# Patient Record
Sex: Female | Born: 1940
Health system: Southern US, Community
[De-identification: ages and names within clinical notes are randomized; demographics above are authoritative.]

## PROBLEM LIST (undated history)

## (undated) DIAGNOSIS — R112 Nausea with vomiting, unspecified: Secondary | ICD-10-CM

## (undated) DIAGNOSIS — Z9889 Other specified postprocedural states: Secondary | ICD-10-CM

## (undated) DIAGNOSIS — I82409 Acute embolism and thrombosis of unspecified deep veins of unspecified lower extremity: Secondary | ICD-10-CM

## (undated) DIAGNOSIS — M1990 Unspecified osteoarthritis, unspecified site: Secondary | ICD-10-CM

## (undated) DIAGNOSIS — R51 Headache: Secondary | ICD-10-CM

## (undated) DIAGNOSIS — F419 Anxiety disorder, unspecified: Secondary | ICD-10-CM

## (undated) DIAGNOSIS — I10 Essential (primary) hypertension: Secondary | ICD-10-CM

## (undated) DIAGNOSIS — K219 Gastro-esophageal reflux disease without esophagitis: Secondary | ICD-10-CM

## (undated) DIAGNOSIS — K2970 Gastritis, unspecified, without bleeding: Secondary | ICD-10-CM

## (undated) HISTORY — DX: Gastritis, unspecified, without bleeding: K29.70

## (undated) HISTORY — PX: COLONOSCOPY: SHX5424

## (undated) HISTORY — PX: ESOPHAGOGASTRODUODENOSCOPY: SHX1529

## (undated) HISTORY — PX: BREAST SURGERY: SHX581

## (undated) HISTORY — PX: TONSILLECTOMY: SUR1361

## (undated) HISTORY — PX: DILATION AND CURETTAGE OF UTERUS: SHX78

## (undated) HISTORY — PX: EYE SURGERY: SHX253

## (undated) HISTORY — PX: APPENDECTOMY: SHX54

## (undated) HISTORY — PX: BACK SURGERY: SHX140

---

## 2013-06-18 NOTE — Progress Notes (Signed)
Need orders in EPIC.  Surgery scheduled for 07/07/13.  Thank You.  

## 2013-06-29 ENCOUNTER — Encounter (HOSPITAL_COMMUNITY): Payer: Self-pay | Admitting: Pharmacy Technician

## 2013-07-01 ENCOUNTER — Other Ambulatory Visit (HOSPITAL_COMMUNITY): Payer: Self-pay | Admitting: Orthopedic Surgery

## 2013-07-01 NOTE — Progress Notes (Signed)
ekg 06-09-13 and medical clearance note dr Bing Quarry on chart

## 2013-07-01 NOTE — Patient Instructions (Addendum)
20 Denise Hawkins  07/01/2013   Your procedure is scheduled on: Tuesday November 18th  Report to [facility] at 725  AM.  Call this number if you have problems the morning of surgery [phone]   Remember:   Do not eat food or drink liquids :After Midnight.     Take these medicines the morning of surgery with A SIP OF WATER: Atenolol, Amlodipine, Omeprazole                                SEE [facility] PREPARING FOR SURGERY SHEET             You may not have any metal on your body including hair pins and piercings  Do not wear jewelry, make-up.  Do not wear lotions, powders, or perfumes. You may wear deodorant.   Men may shave face and neck.  Do not bring valuables to the hospital. [facility] IS NOT RESPONSIBLE FOR VALUEABLES.  Contacts, dentures or bridgework may not be worn into surgery.  Leave suitcase in the car. After surgery it may be brought to your room.  For patients admitted to the hospital, checkout time is 11:00 AM the day of discharge.    Please read over the following fact sheets that you were given: mrsa information, blood fact sheet, incentive spirometer sheet  Call Merleen Nicely RN pre op nurse if needed [phone]    FAILURE TO FOLLOW THESE INSTRUCTIONS MAY RESULT IN THE CANCELLATION OF YOUR SURGERY.  PATIENT SIGNATURE___________________________________________  NURSE SIGNATURE_____________________________________________

## 2013-07-02 ENCOUNTER — Ambulatory Visit (HOSPITAL_COMMUNITY)
Admission: RE | Admit: 2013-07-02 | Discharge: 2013-07-02 | Disposition: A | Discharge status: Home / Self Care | Payer: Medicare Other | Source: Ambulatory Visit | Attending: Orthopedic Surgery | Admitting: Orthopedic Surgery

## 2013-07-02 ENCOUNTER — Encounter (HOSPITAL_COMMUNITY)
Admission: RE | Admit: 2013-07-02 | Discharge: 2013-07-02 | Disposition: A | Discharge status: Home / Self Care | Payer: Medicare Other | Source: Ambulatory Visit | Attending: Orthopedic Surgery | Admitting: Orthopedic Surgery

## 2013-07-02 ENCOUNTER — Encounter (HOSPITAL_COMMUNITY): Payer: Self-pay

## 2013-07-02 DIAGNOSIS — Z01818 Encounter for other preprocedural examination: Secondary | ICD-10-CM | POA: Insufficient documentation

## 2013-07-02 DIAGNOSIS — Z01812 Encounter for preprocedural laboratory examination: Secondary | ICD-10-CM | POA: Insufficient documentation

## 2013-07-02 DIAGNOSIS — M47814 Spondylosis without myelopathy or radiculopathy, thoracic region: Secondary | ICD-10-CM | POA: Insufficient documentation

## 2013-07-02 DIAGNOSIS — M169 Osteoarthritis of hip, unspecified: Secondary | ICD-10-CM | POA: Insufficient documentation

## 2013-07-02 DIAGNOSIS — M1610 Unilateral primary osteoarthritis, unspecified hip: Secondary | ICD-10-CM | POA: Insufficient documentation

## 2013-07-02 HISTORY — DX: Headache: R51

## 2013-07-02 HISTORY — DX: Unspecified osteoarthritis, unspecified site: M19.90

## 2013-07-02 HISTORY — DX: Other specified postprocedural states: Z98.890

## 2013-07-02 HISTORY — DX: Essential (primary) hypertension: I10

## 2013-07-02 HISTORY — DX: Anxiety disorder, unspecified: F41.9

## 2013-07-02 HISTORY — DX: Other specified postprocedural states: R11.2

## 2013-07-02 HISTORY — DX: Gastro-esophageal reflux disease without esophagitis: K21.9

## 2013-07-02 LAB — URINALYSIS, ROUTINE W REFLEX MICROSCOPIC
Bilirubin Urine: NEGATIVE
Glucose, UA: NEGATIVE mg/dL
Leukocytes, UA: NEGATIVE
Nitrite: NEGATIVE
Protein, ur: NEGATIVE mg/dL
pH: 7.5 (ref 5.0–8.0)

## 2013-07-02 LAB — CBC
HCT: 39.1 % (ref 36.0–46.0)
MCHC: 32.5 g/dL (ref 30.0–36.0)
MCV: 92.9 fL (ref 78.0–100.0)
Platelets: 257 10*3/uL (ref 150–400)
RDW: 13.2 % (ref 11.5–15.5)
WBC: 9.5 10*3/uL (ref 4.0–10.5)

## 2013-07-02 LAB — BASIC METABOLIC PANEL
BUN: 16 mg/dL (ref 6–23)
Calcium: 10.1 mg/dL (ref 8.4–10.5)
Chloride: 103 mEq/L (ref 96–112)
Creatinine, Ser: 0.94 mg/dL (ref 0.50–1.10)
GFR calc Af Amer: 68 mL/min — ABNORMAL LOW (ref >90)

## 2013-07-02 LAB — SURGICAL PCR SCREEN
MRSA, PCR: NEGATIVE
Staphylococcus aureus: NEGATIVE

## 2013-07-02 LAB — ABO/RH: ABO/RH(D): A POS

## 2013-07-02 NOTE — Pre-Procedure Instructions (Signed)
Patient states she is ALLERGIC to PENICILLINS AND SULFA MEDICATIONS! Thank You!

## 2013-07-02 NOTE — H&P (Signed)
TOTAL HIP ADMISSION H&P  Patient is admitted for right total hip arthroplasty, anterior approach.  Subjective:  Chief Complaint:    Right hip OA / pain  HPI: Denise Hawkins, 72 y.o. female, has a history of pain and functional disability in the right hip(s) due to arthritis and patient has failed non-surgical conservative treatments for greater than 12 weeks to include NSAID's and/or analgesics, corticosteriod injections, use of assistive devices and activity modification.  Onset of symptoms was gradual starting 1+ years ago with gradually worsening course since that time.The patient noted no past surgery on the right hip(s).  Patient currently rates pain in the right hip at 9 out of 10 with activity. Patient has night pain, worsening of pain with activity and weight bearing, trendelenberg gait, pain that interfers with activities of daily living and pain with passive range of motion. Patient has evidence of periarticular osteophytes and joint space narrowing by imaging studies. This condition presents safety issues increasing the risk of falls.  There is no current active infection.  Risks, benefits and expectations were discussed with the patient.  Risks including but not limited to the risk of anesthesia, blood clots, nerve damage, blood vessel damage, failure of the prosthesis, infection and up to and including death.  Patient understand the risks, benefits and expectations and wishes to proceed with surgery.   D/C Plans:   Home with HHPT (Daughter's house)  Post-op Meds:   No Rx given  Tranexamic Acid:   To be given  Decadron:    To be given  FYI:    Xarelto post-op (possible gastritis issues with ASA, may be willing to try)  Norco post-op   Past Medical History  Diagnosis Date  . PONV (postoperative nausea and vomiting)   . Hypertension   . GERD (gastroesophageal reflux disease)   . Anxiety   . Headache(784.0)     migraines  . Arthritis     Past Surgical History  Procedure  Laterality Date  . Back surgery    . Tonsillectomy    . Appendectomy    . Dilation and curettage of uterus      x4  . Breast surgery      right biopsy-benign  . Eye surgery      left with lens implant    No prescriptions prior to admission   Allergies  Allergen Reactions  . Latex Rash    Blisters  . Penicillins Rash  . Sulfa Antibiotics Rash    History  Substance Use Topics  . Smoking status: Never Smoker   . Smokeless tobacco: Not on file  . Alcohol Use: No    No family history on file.   Review of Systems  Constitutional: Negative.   Eyes: Negative.   Respiratory: Negative.   Cardiovascular: Negative.   Gastrointestinal: Positive for heartburn.  Genitourinary: Negative.   Musculoskeletal: Positive for joint pain.  Skin: Negative.   Neurological: Positive for headaches.  Endo/Heme/Allergies: Negative.   Psychiatric/Behavioral: The patient is nervous/anxious.     Objective:  Physical Exam  Constitutional: She is oriented to person, place, and time. She appears well-developed and well-nourished.  HENT:  Head: Normocephalic and atraumatic.  Mouth/Throat: Oropharynx is clear and moist.  Eyes: Pupils are equal, round, and reactive to light.  Neck: Neck supple. No JVD present. No tracheal deviation present. No thyromegaly present.  Cardiovascular: Normal rate, regular rhythm, normal heart sounds and intact distal pulses.   Respiratory: Effort normal and breath sounds normal. No stridor.  GI: Soft. There is no tenderness. There is no guarding.  Musculoskeletal:       Right hip: She exhibits decreased range of motion, decreased strength, tenderness and bony tenderness. She exhibits no swelling, no deformity and no laceration.  Lymphadenopathy:    She has no cervical adenopathy.  Neurological: She is alert and oriented to person, place, and time.  Skin: Skin is warm and dry.  Psychiatric: She has a normal mood and affect.    Vital signs in last 24 hours: Temp:   [98.1 F (36.7 C)] 98.1 F (36.7 C) (11/13 1133) Pulse Rate:  [66] 66 (11/13 1133) Resp:  [16] 16 (11/13 1133) BP: (137)/(77) 137/77 mmHg (11/13 1133) SpO2:  [96 %] 96 % (11/13 1133) Weight:  [77.565 kg (171 lb)] 77.565 kg (171 lb) (11/13 1133)   Imaging Review Plain radiographs demonstrate severe degenerative joint disease of the right hip(s). The bone quality appears to be good for age and reported activity level.  Assessment/Plan:  End stage arthritis, right hip(s)  The patient history, physical examination, clinical judgement of the provider and imaging studies are consistent with end stage degenerative joint disease of the right hip(s) and total hip arthroplasty is deemed medically necessary. The treatment options including medical management, injection therapy, arthroscopy and arthroplasty were discussed at length. The risks and benefits of total hip arthroplasty were presented and reviewed. The risks due to aseptic loosening, infection, stiffness, dislocation/subluxation,  thromboembolic complications and other imponderables were discussed.  The patient acknowledged the explanation, agreed to proceed with the plan and consent was signed. Patient is being admitted for inpatient treatment for surgery, pain control, PT, OT, prophylactic antibiotics, VTE prophylaxis, progressive ambulation and ADL's and discharge planning.The patient is planning to be discharged home with home health services.     Anastasio Auerbach Desjuan Stearns   PAC  07/02/2013, 6:05 PM

## 2013-07-06 NOTE — Pre-Procedure Instructions (Signed)
Talked to patient's daughter Enid Skeens and informed her of time change for tomorrow-to arrive at Short Stay at 1205 pm  On 07/07/2013. Daughter verbalized understanding.

## 2013-07-07 ENCOUNTER — Inpatient Hospital Stay (HOSPITAL_COMMUNITY): Payer: Medicare Other

## 2013-07-07 ENCOUNTER — Encounter (HOSPITAL_COMMUNITY): Payer: Self-pay | Admitting: *Deleted

## 2013-07-07 ENCOUNTER — Encounter (HOSPITAL_COMMUNITY): Payer: Medicare Other | Admitting: Anesthesiology

## 2013-07-07 ENCOUNTER — Encounter (HOSPITAL_COMMUNITY)
Admission: RE | Disposition: A | Discharge status: Home Health | Payer: Self-pay | Source: Ambulatory Visit | Attending: Orthopedic Surgery

## 2013-07-07 ENCOUNTER — Inpatient Hospital Stay (HOSPITAL_COMMUNITY): Payer: Medicare Other | Admitting: Anesthesiology

## 2013-07-07 ENCOUNTER — Inpatient Hospital Stay (HOSPITAL_COMMUNITY)
Admission: RE | Admit: 2013-07-07 | Discharge: 2013-07-09 | DRG: 470 | Disposition: A | Discharge status: Home Health | Payer: Medicare Other | Source: Ambulatory Visit | Attending: Orthopedic Surgery | Admitting: Orthopedic Surgery

## 2013-07-07 DIAGNOSIS — I10 Essential (primary) hypertension: Secondary | ICD-10-CM | POA: Diagnosis present

## 2013-07-07 DIAGNOSIS — E663 Overweight: Secondary | ICD-10-CM

## 2013-07-07 DIAGNOSIS — K219 Gastro-esophageal reflux disease without esophagitis: Secondary | ICD-10-CM | POA: Diagnosis present

## 2013-07-07 DIAGNOSIS — D500 Iron deficiency anemia secondary to blood loss (chronic): Secondary | ICD-10-CM | POA: Diagnosis not present

## 2013-07-07 DIAGNOSIS — D62 Acute posthemorrhagic anemia: Secondary | ICD-10-CM | POA: Diagnosis not present

## 2013-07-07 DIAGNOSIS — Z96649 Presence of unspecified artificial hip joint: Secondary | ICD-10-CM

## 2013-07-07 DIAGNOSIS — M169 Osteoarthritis of hip, unspecified: Principal | ICD-10-CM | POA: Diagnosis present

## 2013-07-07 DIAGNOSIS — F411 Generalized anxiety disorder: Secondary | ICD-10-CM | POA: Diagnosis present

## 2013-07-07 DIAGNOSIS — M1610 Unilateral primary osteoarthritis, unspecified hip: Principal | ICD-10-CM | POA: Diagnosis present

## 2013-07-07 DIAGNOSIS — Z6825 Body mass index (BMI) 25.0-25.9, adult: Secondary | ICD-10-CM

## 2013-07-07 HISTORY — PX: TOTAL HIP ARTHROPLASTY: SHX124

## 2013-07-07 SURGERY — ARTHROPLASTY, HIP, TOTAL, ANTERIOR APPROACH
Anesthesia: General | Site: Hip | Laterality: Right | Wound class: Clean

## 2013-07-07 MED ORDER — DOCUSATE SODIUM 100 MG PO CAPS
100.0000 mg | ORAL_CAPSULE | Freq: Two times a day (BID) | ORAL | Status: DC
  Administered 2013-07-08 – 2013-07-09 (×3): 100 mg via ORAL

## 2013-07-07 MED ORDER — HYDROMORPHONE HCL PF 1 MG/ML IJ SOLN
0.2500 mg | INTRAMUSCULAR | Status: DC | PRN
  Administered 2013-07-07 (×4): 0.25 mg via INTRAVENOUS

## 2013-07-07 MED ORDER — SUFENTANIL CITRATE 50 MCG/ML IV SOLN
INTRAVENOUS | Status: AC
  Filled 2013-07-07: qty 1

## 2013-07-07 MED ORDER — HYDROMORPHONE HCL PF 1 MG/ML IJ SOLN
0.5000 mg | INTRAMUSCULAR | Status: DC | PRN
  Administered 2013-07-07: 1 mg via INTRAVENOUS
  Filled 2013-07-07: qty 1

## 2013-07-07 MED ORDER — POLYETHYLENE GLYCOL 3350 17 G PO PACK
17.0000 g | PACK | Freq: Two times a day (BID) | ORAL | Status: DC
  Administered 2013-07-08 – 2013-07-09 (×3): 17 g via ORAL

## 2013-07-07 MED ORDER — ATENOLOL 25 MG PO TABS
25.0000 mg | ORAL_TABLET | Freq: Every morning | ORAL | Status: DC
  Administered 2013-07-08 – 2013-07-09 (×2): 25 mg via ORAL
  Filled 2013-07-07 (×2): qty 1

## 2013-07-07 MED ORDER — LIDOCAINE HCL (CARDIAC) 20 MG/ML IV SOLN
INTRAVENOUS | Status: DC | PRN
  Administered 2013-07-07: 100 mg via INTRAVENOUS

## 2013-07-07 MED ORDER — CALCIUM CARBONATE ANTACID 750 MG PO CHEW: 4.0000 | CHEWABLE_TABLET | Freq: Every day | ORAL | Status: DC

## 2013-07-07 MED ORDER — DEXAMETHASONE SODIUM PHOSPHATE 10 MG/ML IJ SOLN
10.0000 mg | Freq: Once | INTRAMUSCULAR | Status: AC
  Administered 2013-07-08: 17:00:00 10 mg via INTRAVENOUS
  Filled 2013-07-07: qty 1

## 2013-07-07 MED ORDER — CLINDAMYCIN PHOSPHATE 600 MG/50ML IV SOLN
600.0000 mg | Freq: Four times a day (QID) | INTRAVENOUS | Status: AC
  Administered 2013-07-07 – 2013-07-08 (×2): 600 mg via INTRAVENOUS
  Filled 2013-07-07 (×2): qty 50

## 2013-07-07 MED ORDER — SODIUM CHLORIDE 0.9 % IV SOLN
100.0000 mL/h | INTRAVENOUS | Status: DC
  Administered 2013-07-07 – 2013-07-08 (×2): 100 mL/h via INTRAVENOUS
  Filled 2013-07-07 (×7): qty 1000

## 2013-07-07 MED ORDER — SCOPOLAMINE 1 MG/3DAYS TD PT72
MEDICATED_PATCH | TRANSDERMAL | Status: AC
  Filled 2013-07-07: qty 1

## 2013-07-07 MED ORDER — PROPOFOL 10 MG/ML IV BOLUS
INTRAVENOUS | Status: DC | PRN
  Administered 2013-07-07: 150 mg via INTRAVENOUS

## 2013-07-07 MED ORDER — SUCCINYLCHOLINE CHLORIDE 20 MG/ML IJ SOLN
INTRAMUSCULAR | Status: AC
  Filled 2013-07-07: qty 1

## 2013-07-07 MED ORDER — MIDAZOLAM HCL 2 MG/2ML IJ SOLN
INTRAMUSCULAR | Status: AC
  Filled 2013-07-07: qty 2

## 2013-07-07 MED ORDER — ZOLPIDEM TARTRATE 5 MG PO TABS: 5.0000 mg | ORAL_TABLET | Freq: Every evening | ORAL | Status: DC | PRN

## 2013-07-07 MED ORDER — NEOSTIGMINE METHYLSULFATE 1 MG/ML IJ SOLN
INTRAMUSCULAR | Status: AC
  Filled 2013-07-07: qty 10

## 2013-07-07 MED ORDER — DEXAMETHASONE SODIUM PHOSPHATE 10 MG/ML IJ SOLN
INTRAMUSCULAR | Status: DC | PRN
  Administered 2013-07-07: 10 mg via INTRAVENOUS

## 2013-07-07 MED ORDER — PROMETHAZINE HCL 25 MG/ML IJ SOLN
6.2500 mg | INTRAMUSCULAR | Status: AC | PRN
  Administered 2013-07-07 (×2): 6.25 mg via INTRAVENOUS

## 2013-07-07 MED ORDER — ONDANSETRON HCL 4 MG/2ML IJ SOLN
INTRAMUSCULAR | Status: DC | PRN
  Administered 2013-07-07: 4 mg via INTRAVENOUS

## 2013-07-07 MED ORDER — CALCIUM CARBONATE ANTACID 500 MG PO CHEW
4.0000 | CHEWABLE_TABLET | Freq: Every day | ORAL | Status: DC
  Administered 2013-07-08 – 2013-07-09 (×2): 800 mg via ORAL
  Filled 2013-07-07 (×2): qty 4

## 2013-07-07 MED ORDER — LACTATED RINGERS IV SOLN
INTRAVENOUS | Status: DC
  Administered 2013-07-07: 1000 mL via INTRAVENOUS
  Administered 2013-07-07: 16:00:00 via INTRAVENOUS

## 2013-07-07 MED ORDER — EPHEDRINE SULFATE 50 MG/ML IJ SOLN
INTRAMUSCULAR | Status: DC | PRN
  Administered 2013-07-07: 10 mg via INTRAVENOUS

## 2013-07-07 MED ORDER — HYDROMORPHONE HCL PF 1 MG/ML IJ SOLN
INTRAMUSCULAR | Status: AC
  Filled 2013-07-07: qty 1

## 2013-07-07 MED ORDER — LORAZEPAM 0.5 MG PO TABS: 0.5000 mg | ORAL_TABLET | Freq: Three times a day (TID) | ORAL | Status: DC | PRN

## 2013-07-07 MED ORDER — METHOCARBAMOL 100 MG/ML IJ SOLN
500.0000 mg | Freq: Four times a day (QID) | INTRAVENOUS | Status: DC | PRN
  Filled 2013-07-07 (×2): qty 5

## 2013-07-07 MED ORDER — HYDROMORPHONE HCL PF 1 MG/ML IJ SOLN
INTRAMUSCULAR | Status: DC | PRN
  Administered 2013-07-07: .4 mg via INTRAVENOUS

## 2013-07-07 MED ORDER — ONDANSETRON HCL 4 MG PO TABS: 4.0000 mg | ORAL_TABLET | Freq: Four times a day (QID) | ORAL | Status: DC | PRN

## 2013-07-07 MED ORDER — METOCLOPRAMIDE HCL 5 MG/ML IJ SOLN: 5.0000 mg | Freq: Three times a day (TID) | INTRAMUSCULAR | Status: DC | PRN

## 2013-07-07 MED ORDER — SUFENTANIL CITRATE 50 MCG/ML IV SOLN
INTRAVENOUS | Status: DC | PRN
  Administered 2013-07-07: 15 ug via INTRAVENOUS
  Administered 2013-07-07: 10 ug via INTRAVENOUS

## 2013-07-07 MED ORDER — NEOSTIGMINE METHYLSULFATE 1 MG/ML IJ SOLN
INTRAMUSCULAR | Status: DC | PRN
  Administered 2013-07-07: 4 mg via INTRAVENOUS

## 2013-07-07 MED ORDER — PROPOFOL 10 MG/ML IV BOLUS
INTRAVENOUS | Status: AC
  Filled 2013-07-07: qty 20

## 2013-07-07 MED ORDER — TRANEXAMIC ACID 100 MG/ML IV SOLN
1000.0000 mg | Freq: Once | INTRAVENOUS | Status: AC
  Administered 2013-07-07: 1000 mg via INTRAVENOUS
  Filled 2013-07-07: qty 10

## 2013-07-07 MED ORDER — AMLODIPINE BESYLATE 5 MG PO TABS
5.0000 mg | ORAL_TABLET | Freq: Every morning | ORAL | Status: DC
  Administered 2013-07-08 – 2013-07-09 (×2): 5 mg via ORAL
  Filled 2013-07-07 (×2): qty 1

## 2013-07-07 MED ORDER — METHOCARBAMOL 500 MG PO TABS
500.0000 mg | ORAL_TABLET | Freq: Four times a day (QID) | ORAL | Status: DC | PRN
  Administered 2013-07-08 – 2013-07-09 (×3): 500 mg via ORAL
  Filled 2013-07-07 (×3): qty 1

## 2013-07-07 MED ORDER — HYDROMORPHONE HCL PF 2 MG/ML IJ SOLN
INTRAMUSCULAR | Status: AC
  Filled 2013-07-07: qty 1

## 2013-07-07 MED ORDER — GLYCOPYRROLATE 0.2 MG/ML IJ SOLN
INTRAMUSCULAR | Status: AC
  Filled 2013-07-07: qty 3

## 2013-07-07 MED ORDER — CEFAZOLIN SODIUM-DEXTROSE 2-3 GM-% IV SOLR
2.0000 g | INTRAVENOUS | Status: AC
  Administered 2013-07-07: 2 g via INTRAVENOUS

## 2013-07-07 MED ORDER — CEFAZOLIN SODIUM-DEXTROSE 2-3 GM-% IV SOLR
INTRAVENOUS | Status: AC
  Filled 2013-07-07: qty 50

## 2013-07-07 MED ORDER — GLYCOPYRROLATE 0.2 MG/ML IJ SOLN
INTRAMUSCULAR | Status: DC | PRN
  Administered 2013-07-07: .6 mg via INTRAVENOUS

## 2013-07-07 MED ORDER — PANTOPRAZOLE SODIUM 40 MG PO TBEC
40.0000 mg | DELAYED_RELEASE_TABLET | Freq: Every day | ORAL | Status: DC
  Administered 2013-07-08 – 2013-07-09 (×2): 40 mg via ORAL
  Filled 2013-07-07 (×2): qty 1

## 2013-07-07 MED ORDER — 0.9 % SODIUM CHLORIDE (POUR BTL) OPTIME
TOPICAL | Status: DC | PRN
  Administered 2013-07-07: 1000 mL

## 2013-07-07 MED ORDER — DEXAMETHASONE SODIUM PHOSPHATE 10 MG/ML IJ SOLN
INTRAMUSCULAR | Status: AC
  Filled 2013-07-07: qty 1

## 2013-07-07 MED ORDER — FERROUS SULFATE 325 (65 FE) MG PO TABS
325.0000 mg | ORAL_TABLET | Freq: Three times a day (TID) | ORAL | Status: DC
  Administered 2013-07-08 – 2013-07-09 (×4): 325 mg via ORAL
  Filled 2013-07-07 (×7): qty 1

## 2013-07-07 MED ORDER — MIDAZOLAM HCL 5 MG/5ML IJ SOLN
INTRAMUSCULAR | Status: DC | PRN
  Administered 2013-07-07: 2 mg via INTRAVENOUS

## 2013-07-07 MED ORDER — PROMETHAZINE HCL 25 MG/ML IJ SOLN
INTRAMUSCULAR | Status: AC
  Filled 2013-07-07: qty 1

## 2013-07-07 MED ORDER — CISATRACURIUM BESYLATE (PF) 10 MG/5ML IV SOLN
INTRAVENOUS | Status: DC | PRN
  Administered 2013-07-07: 6 mg via INTRAVENOUS

## 2013-07-07 MED ORDER — PHENOL 1.4 % MT LIQD: 1.0000 | OROMUCOSAL | Status: DC | PRN

## 2013-07-07 MED ORDER — ALUM & MAG HYDROXIDE-SIMETH 200-200-20 MG/5ML PO SUSP: 30.0000 mL | ORAL | Status: DC | PRN

## 2013-07-07 MED ORDER — METOCLOPRAMIDE HCL 10 MG PO TABS: 5.0000 mg | ORAL_TABLET | Freq: Three times a day (TID) | ORAL | Status: DC | PRN

## 2013-07-07 MED ORDER — DIPHENHYDRAMINE HCL 25 MG PO CAPS: 25.0000 mg | ORAL_CAPSULE | Freq: Four times a day (QID) | ORAL | Status: DC | PRN

## 2013-07-07 MED ORDER — SODIUM CHLORIDE 0.9 % IJ SOLN
INTRAMUSCULAR | Status: AC
  Filled 2013-07-07: qty 10

## 2013-07-07 MED ORDER — CISATRACURIUM BESYLATE 20 MG/10ML IV SOLN
INTRAVENOUS | Status: AC
  Filled 2013-07-07: qty 10

## 2013-07-07 MED ORDER — SUCCINYLCHOLINE CHLORIDE 20 MG/ML IJ SOLN
INTRAMUSCULAR | Status: DC | PRN
  Administered 2013-07-07: 80 mg via INTRAVENOUS

## 2013-07-07 MED ORDER — RIVAROXABAN 10 MG PO TABS
10.0000 mg | ORAL_TABLET | Freq: Every day | ORAL | Status: DC
  Administered 2013-07-08 – 2013-07-09 (×2): 10 mg via ORAL
  Filled 2013-07-07 (×2): qty 1

## 2013-07-07 MED ORDER — HYDROCODONE-ACETAMINOPHEN 7.5-325 MG PO TABS
1.0000 | ORAL_TABLET | ORAL | Status: DC
  Administered 2013-07-07 – 2013-07-09 (×8): 2 via ORAL
  Administered 2013-07-09: 1 via ORAL
  Filled 2013-07-07 (×9): qty 2

## 2013-07-07 MED ORDER — MENTHOL 3 MG MT LOZG: 1.0000 | LOZENGE | OROMUCOSAL | Status: DC | PRN

## 2013-07-07 MED ORDER — ONDANSETRON HCL 4 MG/2ML IJ SOLN: 4.0000 mg | Freq: Four times a day (QID) | INTRAMUSCULAR | Status: DC | PRN

## 2013-07-07 MED ORDER — BISACODYL 10 MG RE SUPP: 10.0000 mg | Freq: Every day | RECTAL | Status: DC | PRN

## 2013-07-07 MED ORDER — ONDANSETRON HCL 4 MG/2ML IJ SOLN
INTRAMUSCULAR | Status: AC
  Filled 2013-07-07: qty 2

## 2013-07-07 MED ORDER — DEXAMETHASONE SODIUM PHOSPHATE 10 MG/ML IJ SOLN
10.0000 mg | Freq: Once | INTRAMUSCULAR | Status: AC
  Administered 2013-07-07: 10 mg via INTRAVENOUS

## 2013-07-07 MED ORDER — FLEET ENEMA 7-19 GM/118ML RE ENEM: 1.0000 | ENEMA | Freq: Once | RECTAL | Status: AC | PRN

## 2013-07-07 SURGICAL SUPPLY — 37 items
BAG ZIPLOCK 12X15 (MISCELLANEOUS) IMPLANT
BLADE SAW SGTL 18X1.27X75 (BLADE) ×2 IMPLANT
CAPT HIP PF MOP ×2 IMPLANT
DERMABOND ADVANCED (GAUZE/BANDAGES/DRESSINGS) ×1
DERMABOND ADVANCED .7 DNX12 (GAUZE/BANDAGES/DRESSINGS) ×1 IMPLANT
DRAPE C-ARM 42X120 X-RAY (DRAPES) ×2 IMPLANT
DRAPE STERI IOBAN 125X83 (DRAPES) ×2 IMPLANT
DRAPE U-SHAPE 47X51 STRL (DRAPES) ×6 IMPLANT
DRSG AQUACEL AG ADV 3.5X10 (GAUZE/BANDAGES/DRESSINGS) ×2 IMPLANT
DRSG TEGADERM 4X4.75 (GAUZE/BANDAGES/DRESSINGS) IMPLANT
DURAPREP 26ML APPLICATOR (WOUND CARE) ×2 IMPLANT
ELECT BLADE TIP CTD 4 INCH (ELECTRODE) ×2 IMPLANT
ELECT REM PT RETURN 9FT ADLT (ELECTROSURGICAL) ×2
ELECTRODE REM PT RTRN 9FT ADLT (ELECTROSURGICAL) ×1 IMPLANT
EVACUATOR 1/8 PVC DRAIN (DRAIN) IMPLANT
FACESHIELD LNG OPTICON STERILE (SAFETY) ×8 IMPLANT
GAUZE SPONGE 2X2 8PLY STRL LF (GAUZE/BANDAGES/DRESSINGS) IMPLANT
GLOVE BIOGEL PI IND STRL 7.5 (GLOVE) ×1 IMPLANT
GLOVE BIOGEL PI IND STRL 8 (GLOVE) ×1 IMPLANT
GLOVE BIOGEL PI INDICATOR 7.5 (GLOVE) ×1
GLOVE BIOGEL PI INDICATOR 8 (GLOVE) ×1
GLOVE ECLIPSE 8.0 STRL XLNG CF (GLOVE) ×2 IMPLANT
GLOVE ORTHO TXT STRL SZ7.5 (GLOVE) ×4 IMPLANT
GOWN BRE IMP PREV XXLGXLNG (GOWN DISPOSABLE) ×2 IMPLANT
GOWN PREVENTION PLUS LG XLONG (DISPOSABLE) ×2 IMPLANT
KIT BASIN OR (CUSTOM PROCEDURE TRAY) ×2 IMPLANT
PACK TOTAL JOINT (CUSTOM PROCEDURE TRAY) ×2 IMPLANT
PADDING CAST COTTON 6X4 STRL (CAST SUPPLIES) ×2 IMPLANT
SPONGE GAUZE 2X2 STER 10/PKG (GAUZE/BANDAGES/DRESSINGS)
SUCTION FRAZIER 12FR DISP (SUCTIONS) IMPLANT
SUT MNCRL AB 4-0 PS2 18 (SUTURE) ×2 IMPLANT
SUT VIC AB 1 CT1 36 (SUTURE) ×6 IMPLANT
SUT VIC AB 2-0 CT1 27 (SUTURE) ×2
SUT VIC AB 2-0 CT1 TAPERPNT 27 (SUTURE) ×2 IMPLANT
SUT VLOC 180 0 24IN GS25 (SUTURE) ×2 IMPLANT
TOWEL OR 17X26 10 PK STRL BLUE (TOWEL DISPOSABLE) ×2 IMPLANT
TRAY FOLEY CATH 14FRSI W/METER (CATHETERS) ×2 IMPLANT

## 2013-07-07 NOTE — Anesthesia Procedure Notes (Signed)
Procedure Name: Intubation Date/Time: 07/07/2013 3:22 PM Performed by: Leroy Libman L Patient Re-evaluated:Patient Re-evaluated prior to inductionOxygen Delivery Method: Circle system utilized Preoxygenation: Pre-oxygenation with 100% oxygen Intubation Type: IV induction Ventilation: Mask ventilation without difficulty and Oral airway inserted - appropriate to patient size Laryngoscope Size: Hyacinth Meeker and 2 Grade View: Grade II Tube type: Oral Tube size: 7.5 mm Number of attempts: 1 Airway Equipment and Method: Stylet Placement Confirmation: ETT inserted through vocal cords under direct vision,  breath sounds checked- equal and bilateral and positive ETCO2 Secured at: 20 cm Tube secured with: Tape Dental Injury: Teeth and Oropharynx as per pre-operative assessment

## 2013-07-07 NOTE — Preoperative (Signed)
Beta Blockers   Reason not to administer Beta Blockers:Not Applicable 

## 2013-07-07 NOTE — Interval H&P Note (Signed)
History and Physical Interval Note:  07/07/2013 3:02 PM  [name]  has presented today for surgery, with the diagnosis of osteoarthritis of the right hip  The various methods of treatment have been discussed with the patient and family. After consideration of risks, benefits and other options for treatment, the patient has consented to  Procedure(s): RIGHT TOTAL HIP ARTHROPLASTY ANTERIOR APPROACH (Right) as a surgical intervention .  The patient's history has been reviewed, patient examined, no change in status, stable for surgery.  I have reviewed the patient's chart and labs.  Questions were answered to the patient's satisfaction.     Denise Hawkins

## 2013-07-07 NOTE — Anesthesia Preprocedure Evaluation (Addendum)
Anesthesia Evaluation  Patient identified by MRN, date of birth, ID band Patient awake    Reviewed: Allergy & Precautions, H&P , NPO status , Patient's Chart, lab work & pertinent test results  History of Anesthesia Complications (+) PONV and history of anesthetic complications  Airway Mallampati: II TM Distance: >3 FB Neck ROM: Full    Dental no notable dental hx.    Pulmonary neg pulmonary ROS,  breath sounds clear to auscultation  Pulmonary exam normal       Cardiovascular hypertension, Pt. on medications and Pt. on home beta blockers negative cardio ROS  Rhythm:Regular Rate:Normal     Neuro/Psych  Headaches, Anxiety    GI/Hepatic Neg liver ROS, GERD-  Medicated,  Endo/Other  negative endocrine ROS  Renal/GU negative Renal ROS  negative genitourinary   Musculoskeletal negative musculoskeletal ROS (+)   Abdominal   Peds negative pediatric ROS (+)  Hematology negative hematology ROS (+)   Anesthesia Other Findings   Reproductive/Obstetrics negative OB ROS                         Anesthesia Physical Anesthesia Plan  ASA: II  Anesthesia Plan: General   Post-op Pain Management:    Induction: Intravenous  Airway Management Planned: Oral ETT  Additional Equipment:   Intra-op Plan:   Post-operative Plan: Extubation in OR  Informed Consent: I have reviewed the patients History and Physical, chart, labs and discussed the procedure including the risks, benefits and alternatives for the proposed anesthesia with the patient or authorized representative who has indicated his/her understanding and acceptance.   Dental advisory given  Plan Discussed with: CRNA  Anesthesia Plan Comments: (Discussed r/b general versus spinal. Patient prefers general.)       Anesthesia Quick Evaluation

## 2013-07-07 NOTE — Transfer of Care (Signed)
Immediate Anesthesia Transfer of Care Note  Patient: Denise Hawkins  Procedure(s) Performed: Procedure(s): RIGHT TOTAL HIP ARTHROPLASTY ANTERIOR APPROACH (Right)  Patient Location: PACU  Anesthesia Type:General  Level of Consciousness: awake, alert  and oriented  Airway & Oxygen Therapy: Patient Spontanous Breathing and Patient connected to face mask oxygen  Post-op Assessment: Report given to PACU RN and Post -op Vital signs reviewed and stable  Post vital signs: Reviewed and stable  Complications: No apparent anesthesia complications

## 2013-07-07 NOTE — Op Note (Signed)
NAME:  Denise Hawkins                ACCOUNT NO.: [id number]      MEDICAL RECORD NO.: [id number]      FACILITY:  [facility]      PHYSICIAN:  Durene Romans D  DATE OF BIRTH:  01-19-1941     DATE OF PROCEDURE:  07/07/2013                                 OPERATIVE REPORT         PREOPERATIVE DIAGNOSIS: Right  hip osteoarthritis.      POSTOPERATIVE DIAGNOSIS:  Right hip osteoarthritis.      PROCEDURE:  Right total hip replacement through an anterior approach   utilizing DePuy THR system, component size 50mm pinnacle cup, a size 32+4 neutral   Altrex liner, a size 6 Hi Tri Lock stem with a 32+1 Articuleze metal ball.      SURGEON:  Madlyn Frankel. Charlann Boxer, M.D.      ASSISTANT:  Lanney Gins, PA-C     ANESTHESIA:  General.      SPECIMENS:  None.      COMPLICATIONS:  None.      BLOOD LOSS:  300 cc     DRAINS:  none.      INDICATION OF THE PROCEDURE:  Denise Hawkins is a 72 y.o. female who had   presented to office for evaluation of right hip pain.  Radiographs revealed   progressive degenerative changes with bone-on-bone   articulation to the  hip joint.  The patient had painful limited range of   motion significantly affecting their overall quality of life.  The patient was failing to    respond to conservative measures, and at this point was ready   to proceed with more definitive measures.  The patient has noted progressive   degenerative changes in his hip, progressive problems and dysfunction   with regarding the hip prior to surgery.  Consent was obtained for   benefit of pain relief.  Specific risk of infection, DVT, component   failure, dislocation, need for revision surgery, as well discussion of   the anterior versus posterior approach were reviewed.  Consent was   obtained for benefit of anterior pain relief through an anterior   approach.      PROCEDURE IN DETAIL:  The patient was brought to operative theater.   Once adequate anesthesia,  preoperative antibiotics, 2gm Ancef administered.   The patient was positioned supine on the OSI Hanna table.  Once adequate   padding of boney process was carried out, we had predraped out the hip, and  used fluoroscopy to confirm orientation of the pelvis and position.      The right hip was then prepped and draped from proximal iliac crest to   mid thigh with shower curtain technique.      Time-out was performed identifying the patient, planned procedure, and   extremity.     An incision was then made 2 cm distal and lateral to the   anterior superior iliac spine extending over the orientation of the   tensor fascia lata muscle and sharp dissection was carried down to the   fascia of the muscle and protractor placed in the soft tissues.      The fascia was then incised.  The muscle belly was identified and swept   laterally  and retractor placed along the superior neck.  Following   cauterization of the circumflex vessels and removing some pericapsular   fat, a second cobra retractor was placed on the inferior neck.  A third   retractor was placed on the anterior acetabulum after elevating the   anterior rectus.  A L-capsulotomy was along the line of the   superior neck to the trochanteric fossa, then extended proximally and   distally.  Tag sutures were placed and the retractors were then placed   intracapsular.  We then identified the trochanteric fossa and   orientation of my neck cut, confirmed this radiographically   and then made a neck osteotomy with the femur on traction.  The femoral   head was removed without difficulty or complication.  Traction was let   off and retractors were placed posterior and anterior around the   acetabulum.      The labrum and foveal tissue were debrided.  I began reaming with a 47mm   reamer and reamed up to 49mm reamer with good bony bed preparation and a 50   cup was chosen.  The final 50mm Pinnacle cup was then impacted under fluoroscopy  to  confirm the depth of penetration and orientation with respect to   abduction.  A screw was placed followed by the hole eliminator.  The final   32+4 neutral Altrex liner was impacted with good visualized rim fit.  The cup was positioned anatomically within the acetabular portion of the pelvis.      At this point, the femur was rolled at 80 degrees.  Further capsule was   released off the inferior aspect of the femoral neck.  I then   released the superior capsule proximally.  The hook was placed laterally   along the femur and elevated manually and held in position with the bed   hook.  The leg was then extended and adducted with the leg rolled to 100   degrees of external rotation.  Once the proximal femur was fully   exposed, I used a box osteotome to set orientation.  I then began   broaching with the starting chili pepper broach and passed this by hand and then broached up to 6.  With the 6 broach in place I chose a high offset neck and did a trial reduction.  The offset was appropriate, leg lengths   appeared to be equal, confirmed radiographically.   Given these findings, I went ahead and dislocated the hip, repositioned all   retractors and positioned the right hip in the extended and abducted position.  The final 6 Hi Tri Lock stem was   chosen and it was impacted down to the level of neck cut.  Based on this   and the trial reduction, a 32+1 Articuleze metal ball was chosen and   impacted onto a clean and dry trunnion, and the hip was reduced.  The   hip had been irrigated throughout the case again at this point.  I did   reapproximate the superior capsular leaflet to the anterior leaflet   using #1 Vicryl.  The fascia of the   tensor fascia lata muscle was then reapproximated using #1 Vicryl and #0 V-lock sutures.  The   remaining wound was closed with 2-0 Vicryl and running 4-0 Monocryl.   The hip was cleaned, dried, and dressed sterilely using Dermabond and   Aquacel dressing.   She was then brought   to recovery room in  stable condition tolerating the procedure well.    Lanney Gins, PA-C was present for the entirety of the case involved from   preoperative positioning, perioperative retractor management, general   facilitation of the case, as well as primary wound closure as assistant.            Madlyn Frankel Charlann Boxer, M.D.        07/07/2013 4:52 PM

## 2013-07-07 NOTE — Anesthesia Postprocedure Evaluation (Signed)
  Anesthesia Post-op Note  Patient: Denise Hawkins  Procedure(s) Performed: Procedure(s) (LRB): RIGHT TOTAL HIP ARTHROPLASTY ANTERIOR APPROACH (Right)  Patient Location: PACU  Anesthesia Type: General  Level of Consciousness: awake and alert   Airway and Oxygen Therapy: Patient Spontanous Breathing  Post-op Pain: mild  Post-op Assessment: Post-op Vital signs reviewed, Patient's Cardiovascular Status Stable, Respiratory Function Stable, Patent Airway and No signs of Nausea or vomiting  Last Vitals:  Filed Vitals:   07/07/13 1717  BP: 123/71  Pulse: 75  Temp: 36.3 C  Resp: 12    Post-op Vital Signs: stable   Complications: No apparent anesthesia complications

## 2013-07-08 ENCOUNTER — Encounter (HOSPITAL_COMMUNITY): Payer: Self-pay | Admitting: Orthopedic Surgery

## 2013-07-08 DIAGNOSIS — D500 Iron deficiency anemia secondary to blood loss (chronic): Secondary | ICD-10-CM | POA: Diagnosis not present

## 2013-07-08 DIAGNOSIS — E663 Overweight: Secondary | ICD-10-CM

## 2013-07-08 LAB — BASIC METABOLIC PANEL
BUN: 17 mg/dL (ref 6–23)
Creatinine, Ser: 0.84 mg/dL (ref 0.50–1.10)
GFR calc Af Amer: 78 mL/min — ABNORMAL LOW (ref >90)
GFR calc non Af Amer: 67 mL/min — ABNORMAL LOW (ref >90)
Potassium: 4.7 mEq/L (ref 3.5–5.1)

## 2013-07-08 LAB — CBC
HCT: 33 % — ABNORMAL LOW (ref 36.0–46.0)
MCH: 30.8 pg (ref 26.0–34.0)
MCHC: 33 g/dL (ref 30.0–36.0)
Platelets: 197 10*3/uL (ref 150–400)
RDW: 12.8 % (ref 11.5–15.5)

## 2013-07-08 NOTE — Progress Notes (Signed)
Utilization review completed.  

## 2013-07-08 NOTE — Progress Notes (Signed)
   Subjective: 1 Day Post-Op Procedure(s) (LRB): RIGHT TOTAL HIP ARTHROPLASTY ANTERIOR APPROACH (Right)   Patient reports pain as mild, pain controlled. No events throughout the night.   Objective:   VITALS:   Filed Vitals:   07/08/13 0650  BP: 117/74  Pulse: 63  Temp: 97.7 F (36.5 C)  Resp: 14    Neurovascular intact Dorsiflexion/Plantar flexion intact Incision: dressing C/D/I No cellulitis present Compartment soft  LABS  Recent Labs  07/08/13 0355  HGB 10.9*  HCT 33.0*  WBC 11.4*  PLT 197     Recent Labs  07/08/13 0355  NA 136  K 4.7  BUN 17  CREATININE 0.84  GLUCOSE 165*     Assessment/Plan: 1 Day Post-Op Procedure(s) (LRB): RIGHT TOTAL HIP ARTHROPLASTY ANTERIOR APPROACH (Right) Foley cath d/c'ed Advance diet Up with therapy D/C IV fluids Discharge home with home health eventually, possibly on Thursday  Expected ABLA  Treated with iron and will observe  Overweight (BMI 25-29.9) Estimated body mass index is 26.01 kg/(m^2) as calculated from the following:   Height as of this encounter: 5\' 8"  (1.727 m).   Weight as of this encounter: 77.565 kg (171 lb). Patient also counseled that weight may inhibit the healing process Patient counseled that losing weight will help with future health issues      Anastasio Auerbach. Shevelle Smither   PAC  07/08/2013, 7:41 AM

## 2013-07-08 NOTE — Progress Notes (Signed)
[facility]  [facility] is providing the following services: [employer] - (patient already has rw at home)  If patient discharges after hours, please call [phone].   Renard Hamper 07/08/2013, 10:22 AM

## 2013-07-08 NOTE — Progress Notes (Signed)
OT Cancellation Note  Patient Details Name: Denise Hawkins MRN: [id number] DOB: 08-Feb-1941   Cancelled Treatment:    Reason Eval/Treat Not Completed: Other (comment).  Pt nauseas with PT while ambulating. Will likely check back tomorrow.    Sharion Grieves 07/08/2013, 10:12 AM Marica Otter, OTR/L [phone] 07/08/2013

## 2013-07-08 NOTE — Evaluation (Signed)
Physical Therapy Evaluation Patient Details Name: Denise Hawkins MRN: [id number] DOB: Jun 10, 1941 Today's Date: 07/08/2013 Time: 7829-5621 PT Time Calculation (min): 29 min  PT Assessment / Plan / Recommendation History of Present Illness     Clinical Impression  Pt s/p R THR presents with decreased R LE strength/ROM and post op pain limiting functional mobility.  Pt should progress well to d/c home with family assist and HHPT follow up.    PT Assessment  Patient needs continued PT services    Follow Up Recommendations  Home health PT    Does the patient have the potential to tolerate intense rehabilitation      Barriers to Discharge        Equipment Recommendations  None recommended by PT    Recommendations for Other Services OT consult   Frequency 7X/week    Precautions / Restrictions Precautions Precautions: Fall Restrictions Weight Bearing Restrictions: No Other Position/Activity Restrictions: WBAT   Pertinent Vitals/Pain 5/10; premed, ice pack provided.      Mobility  Bed Mobility Bed Mobility: Supine to Sit Supine to Sit: 3: Mod assist Details for Bed Mobility Assistance: cues for sequence and use of L LE to self assist Transfers Transfers: Sit to Stand;Stand to Sit Sit to Stand: 4: Min assist;3: Mod assist Stand to Sit: 4: Min assist Details for Transfer Assistance: cues for LE management and use of UEs to self assist Ambulation/Gait Ambulation/Gait Assistance: 4: Min assist;3: Mod assist Ambulation Distance (Feet): 14 Feet Assistive device: Rolling walker Ambulation/Gait Assistance Details: cues for sequence, posture, position from RW and stride length Gait Pattern: Step-to pattern;Decreased step length - right;Decreased step length - left;Shuffle;Antalgic General Gait Details: LTd by onset nausea/dizziness    Exercises Total Joint Exercises Ankle Circles/Pumps: AROM;15 reps;Supine;Both Quad Sets: AROM;Both;10 reps;Supine Heel Slides: AAROM;15  reps;Supine;Right Hip ABduction/ADduction: AAROM;Right;10 reps;Supine   PT Diagnosis: Difficulty walking  PT Problem List: Decreased strength;Decreased range of motion;Decreased activity tolerance;Decreased mobility;Decreased knowledge of use of DME;Pain PT Treatment Interventions: DME instruction;Gait training;Stair training;Functional mobility training;Therapeutic activities;Therapeutic exercise;Patient/family education     PT Goals(Current goals can be found in the care plan section) Acute Rehab PT Goals Patient Stated Goal: Be able to walk without a walker PT Goal Formulation: With patient Time For Goal Achievement: 07/15/13 Potential to Achieve Goals: Good  Visit Information  Last PT Received On: 07/08/13 Assistance Needed: +2 (dizzines/nausea with ambulation)       Prior Functioning  Home Living Family/patient expects to be discharged to:: Private residence Living Arrangements: Spouse/significant other Available Help at Discharge: Family Type of Home: House Home Access: Stairs to enter Secretary/administrator of Steps: 2 Entrance Stairs-Rails: None Home Layout: One level Home Equipment: Environmental consultant - 2 wheels Prior Function Level of Independence: Independent with assistive device(s) Communication Communication: No difficulties Dominant Hand: Right    Cognition  Cognition Arousal/Alertness: Awake/alert Behavior During Therapy: WFL for tasks assessed/performed Overall Cognitive Status: Within Functional Limits for tasks assessed    Extremity/Trunk Assessment Upper Extremity Assessment Upper Extremity Assessment: Overall WFL for tasks assessed Lower Extremity Assessment Lower Extremity Assessment: RLE deficits/detail RLE Deficits / Details: Hip strength 2/5 with AROM to 90 flex and 15 abd   Balance    End of Session PT - End of Session Equipment Utilized During Treatment: Gait belt Activity Tolerance: Patient tolerated treatment well Patient left: in chair;with call  bell/phone within reach;with family/visitor present Nurse Communication: Mobility status  GP     Julene Rahn 07/08/2013, 11:55 AM

## 2013-07-08 NOTE — Progress Notes (Signed)
Physical Therapy Treatment Patient Details Name: Denise Hawkins MRN: [id number] DOB: [birth year] Today's Date: 07/08/2013 Time: 4098-1191 PT Time Calculation (min): 24 min  PT Assessment / Plan / Recommendation  History of Present Illness     PT Comments   Pt with marked improvement in activity tolerance this pm.   Follow Up Recommendations  Home health PT     Does the patient have the potential to tolerate intense rehabilitation     Barriers to Discharge        Equipment Recommendations  None recommended by PT    Recommendations for Other Services OT consult  Frequency 7X/week   Progress towards PT Goals Progress towards PT goals: Progressing toward goals  Plan Current plan remains appropriate    Precautions / Restrictions Precautions Precautions: Fall Restrictions Weight Bearing Restrictions: No Other Position/Activity Restrictions: WBAT   Pertinent Vitals/Pain 6/10; premed, ice pack provided    Mobility  Bed Mobility Bed Mobility: Supine to Sit;Sit to Supine Supine to Sit: 4: Min assist Sit to Supine: 4: Min assist Details for Bed Mobility Assistance: cues for sequence and use of L LE to self assist Transfers Transfers: Sit to Stand;Stand to Sit Sit to Stand: 4: Min assist;With armrests;With upper extremity assist;From bed;From chair/3-in-1 Stand to Sit: 4: Min assist;To bed;To chair/3-in-1;With armrests;With upper extremity assist Details for Transfer Assistance: cues for LE management and use of UEs to self assist Ambulation/Gait Ambulation/Gait Assistance: 4: Min assist Ambulation Distance (Feet): 120 Feet (and 5' from chair to bed) Assistive device: Rolling walker Ambulation/Gait Assistance Details: cues for sequence, posture, position from RW and stride length Gait Pattern: Step-to pattern;Decreased step length - right;Decreased step length - left;Shuffle;Antalgic    Exercises     PT Diagnosis:    PT Problem List:   PT Treatment Interventions:      PT Goals (current goals can now be found in the care plan section) Acute Rehab PT Goals Patient Stated Goal: Be able to walk without a walker PT Goal Formulation: With patient Time For Goal Achievement: 07/15/13 Potential to Achieve Goals: Good  Visit Information  Last PT Received On: 07/08/13 Assistance Needed: +1    Subjective Data  Patient Stated Goal: Be able to walk without a walker   Cognition  Cognition Arousal/Alertness: Awake/alert Behavior During Therapy: WFL for tasks assessed/performed Overall Cognitive Status: Within Functional Limits for tasks assessed    Balance     End of Session PT - End of Session Equipment Utilized During Treatment: Gait belt Activity Tolerance: Patient tolerated treatment well Patient left: with call bell/phone within reach;with family/visitor present;in bed Nurse Communication: Mobility status   GP     Denise Hawkins 07/08/2013, 4:04 PM

## 2013-07-09 LAB — BASIC METABOLIC PANEL
BUN: 12 mg/dL (ref 6–23)
CO2: 26 mEq/L (ref 19–32)
Calcium: 9.5 mg/dL (ref 8.4–10.5)
Chloride: 104 mEq/L (ref 96–112)
Creatinine, Ser: 0.72 mg/dL (ref 0.50–1.10)
Potassium: 4.5 mEq/L (ref 3.5–5.1)
Sodium: 137 mEq/L (ref 135–145)

## 2013-07-09 LAB — CBC
HCT: 30.1 % — ABNORMAL LOW (ref 36.0–46.0)
MCHC: 32.9 g/dL (ref 30.0–36.0)
MCV: 92.9 fL (ref 78.0–100.0)
RBC: 3.24 MIL/uL — ABNORMAL LOW (ref 3.87–5.11)
RDW: 13.1 % (ref 11.5–15.5)
WBC: 15.3 10*3/uL — ABNORMAL HIGH (ref 4.0–10.5)

## 2013-07-09 MED ORDER — TIZANIDINE HCL 4 MG PO TABS: 4.0000 mg | ORAL_TABLET | Freq: Four times a day (QID) | ORAL | Status: DC | PRN

## 2013-07-09 MED ORDER — FERROUS SULFATE 325 (65 FE) MG PO TABS: 325.0000 mg | ORAL_TABLET | Freq: Three times a day (TID) | ORAL | Status: DC

## 2013-07-09 MED ORDER — POLYETHYLENE GLYCOL 3350 17 G PO PACK: 17.0000 g | PACK | Freq: Two times a day (BID) | ORAL | Status: DC

## 2013-07-09 MED ORDER — DSS 100 MG PO CAPS: 100.0000 mg | ORAL_CAPSULE | Freq: Two times a day (BID) | ORAL | Status: DC

## 2013-07-09 MED ORDER — RIVAROXABAN 10 MG PO TABS: 10.0000 mg | ORAL_TABLET | Freq: Every day | ORAL | Status: DC

## 2013-07-09 MED ORDER — HYDROCODONE-ACETAMINOPHEN 7.5-325 MG PO TABS: 1.0000 | ORAL_TABLET | ORAL | Status: DC | PRN

## 2013-07-09 NOTE — Progress Notes (Signed)
Physical Therapy Treatment Patient Details Name: Denise Hawkins MRN: [id number] DOB: November 23, 1940 Today's Date: 07/09/2013 Time: 4098-1191 PT Time Calculation (min): 29 min  PT Assessment / Plan / Recommendation  History of Present Illness R direct anterior THA   PT Comments   Reviewed stairs and car transfers with pt and family members  Follow Up Recommendations  Home health PT     Does the patient have the potential to tolerate intense rehabilitation     Barriers to Discharge        Equipment Recommendations  None recommended by PT    Recommendations for Other Services OT consult  Frequency 7X/week   Progress towards PT Goals Progress towards PT goals: Progressing toward goals  Plan Current plan remains appropriate    Precautions / Restrictions Precautions Precautions: Fall Restrictions Weight Bearing Restrictions: No Other Position/Activity Restrictions: WBAT   Pertinent Vitals/Pain 3/10    Mobility  Bed Mobility Bed Mobility: Sit to Supine Supine to Sit: 4: Min assist Sit to Supine: 4: Min guard Details for Bed Mobility Assistance: cues for sequence and use of L LE to self assist Transfers Transfers: Sit to Stand;Stand to Sit Sit to Stand: 4: Min guard;5: Supervision Stand to Sit: 4: Min guard;5: Supervision Details for Transfer Assistance: VC for use of UEs to self assist Ambulation/Gait Ambulation/Gait Assistance: 4: Min guard;5: Supervision Ambulation Distance (Feet): 222 Feet Assistive device: Rolling walker Ambulation/Gait Assistance Details: min cues for posture and position from RW Gait Pattern: Step-to pattern;Step-through pattern;Decreased step length - right;Decreased step length - left;Shuffle;Antalgic;Trunk flexed Stairs: Yes Stairs Assistance: 4: Min assist Stairs Assistance Details (indicate cue type and reason): cues for sequence and foot/RW placement Stair Management Technique: No rails;Step to pattern;Backwards;With walker Number of  Stairs: 6    Exercises     PT Diagnosis:    PT Problem List:   PT Treatment Interventions:     PT Goals (current goals can now be found in the care plan section) Acute Rehab PT Goals Patient Stated Goal: be independent PT Goal Formulation: With patient Time For Goal Achievement: 07/15/13 Potential to Achieve Goals: Good  Visit Information  Last PT Received On: 07/09/13 Assistance Needed: +1 History of Present Illness: R direct anterior THA    Subjective Data  Subjective: I'm ready to go home Patient Stated Goal: be independent   Cognition  Cognition Arousal/Alertness: Awake/alert Behavior During Therapy: WFL for tasks assessed/performed Overall Cognitive Status: Within Functional Limits for tasks assessed    Balance  Balance Balance Assessed: Yes Dynamic Standing Balance Dynamic Standing - Level of Assistance: 4: Min assist  End of Session PT - End of Session Equipment Utilized During Treatment: Gait belt Activity Tolerance: Patient tolerated treatment well Patient left: with call bell/phone within reach;with family/visitor present;in bed Nurse Communication: Mobility status   GP     Lucious Zou 07/09/2013, 11:30 AM

## 2013-07-09 NOTE — Care Management Note (Signed)
    Page 1 of 2   07/09/2013     5:41:40 PM   CARE MANAGEMENT NOTE 07/09/2013  Patient:  Denise Hawkins, Denise Hawkins   Account Number:  [id number]  Date Initiated:  07/09/2013  Documentation initiated by:  Colleen Can  Subjective/Objective Assessment:   dx Total right hip replacemnt    [facility]evieve Norlander will provide [facility] services with start of tomorrow.     Action/Plan:   CM spoke with patient and her family. Plans are for patient to go to her daughter's home in [place] where she will be cared for. Pt lives in [place]. Genevieve Norlander [facility] services will provide [facility] services.   Anticipated DC Date:  07/09/2013   Anticipated DC Plan:  HOME W HOME HEALTH SERVICES  In-house referral  Clinical Social Worker      DC Planning Services  CM consult      [facility] Choice  HOME HEALTH  DURABLE MEDICAL EQUIPMENT   Choice offered to / List presented to:  C-4 Adult Children   DME arranged  3-N-1      DME agency  [facility]     HH arranged  HH-2 PT      [facility] agency  [facility]   Status of service:  Completed, signed off Medicare Important Message given?  NA - LOS <3 / Initial given by admissions (If response is "NO", the following Medicare IM given date fields will be blank) Date Medicare IM given:   Date Additional Medicare IM given:    Discharge Disposition:  HOME W HOME HEALTH SERVICES  Per UR Regulation:    If discussed at Long Length of Stay Meetings, dates discussed:    Comments:

## 2013-07-09 NOTE — Progress Notes (Signed)
   Subjective: 2 Days Post-Op Procedure(s) (LRB): RIGHT TOTAL HIP ARTHROPLASTY ANTERIOR APPROACH (Right)   Patient reports pain as mild, pain well controlled. No events throughout the night. Ready to be discharged home.   Objective:   VITALS:   Filed Vitals:   07/09/13 0533  BP: 116/70  Pulse: 64  Temp: 97.9 F (36.6 C)  Resp: 16    Neurovascular intact Dorsiflexion/Plantar flexion intact Incision: dressing C/D/I No cellulitis present Compartment soft  LABS  Recent Labs  07/08/13 0355 07/09/13 0405  HGB 10.9* 9.9*  HCT 33.0* 30.1*  WBC 11.4* 15.3*  PLT 197 175     Recent Labs  07/08/13 0355 07/09/13 0405  NA 136 137  K 4.7 4.5  BUN 17 12  CREATININE 0.84 0.72  GLUCOSE 165* 167*     Assessment/Plan: 2 Days Post-Op Procedure(s) (LRB): RIGHT TOTAL HIP ARTHROPLASTY ANTERIOR APPROACH (Right) Up with therapy Discharge home with home health Follow up in 2 weeks at [facility]. Follow up with OLIN,[place] Salamone D in 2 weeks.  Contact information:  [facility] [address], [address] [place] [address] [phone]    Expected ABLA  Treated with iron and will observe   Overweight (BMI 25-29.9)  Estimated body mass index is 26.01 kg/(m^2) as calculated from the following:      Height as of this encounter: 5\' 8"  (1.727 m).      Weight as of this encounter: 77.565 kg (171 lb).  Patient also counseled that weight may inhibit the healing process  Patient counseled that losing weight will help with future health issues    Anastasio Auerbach. Nohealani Medinger   PAC  07/09/2013, 8:16 AM

## 2013-07-09 NOTE — Evaluation (Signed)
Occupational Therapy Evaluation Patient Details Name: Denise Hawkins MRN: [id number] DOB: [birth year] Today's Date: 07/09/2013 Time: 0822-0849 OT Time Calculation (min): 27 min  OT Assessment / Plan / Recommendation History of present illness R direct anterior THA   Clinical Impression   Pt is doing well. Daughter present for session and can help PRN at d/c. Pt supposed to d/c today. All education completed.     OT Assessment  Patient does not need any further OT services    Follow Up Recommendations  No OT follow up;Supervision/Assistance - 24 hour    Barriers to Discharge      Equipment Recommendations  3 in 1 bedside comode    Recommendations for Other Services    Frequency       Precautions / Restrictions Precautions Precautions: Fall Restrictions Weight Bearing Restrictions: No Other Position/Activity Restrictions: WBAT   Pertinent Vitals/Pain 4/10 R hip; reposition, ice    ADL  Eating/Feeding: Simulated;Independent Where Assessed - Eating/Feeding: Chair Grooming: Performed;Wash/dry hands;Min guard Where Assessed - Grooming: Unsupported standing Upper Body Bathing: Simulated;Chest;Right arm;Left arm;Abdomen;Set up Where Assessed - Upper Body Bathing: Unsupported sitting Lower Body Bathing: Simulated;Minimal assistance Where Assessed - Lower Body Bathing: Supported sit to stand Upper Body Dressing: Performed;Set up Where Assessed - Upper Body Dressing: Unsupported sitting Lower Body Dressing: Performed;Minimal assistance Where Assessed - Lower Body Dressing: Supported sit to stand Toilet Transfer: Performed;Min guard Acupuncturist: Raised toilet seat with arms (or 3-in-1 over toilet) Toileting - Architect and Hygiene: Min guard Where Assessed - Glass blower/designer Manipulation and Hygiene: Standing Equipment Used: Long-handled shoe horn;Long-handled sponge;Reacher;Rolling walker;Sock aid ADL Comments: Demonstrated all AE to pt and  daughter but pt is able to reach down well enough to don pants and underwear. Pt plans to get AE to use initially. Needs a little assist with socks. Pt plans to sponge bathe initially at daughters. Instructed daughter on how to assist with steadying pt if needed with pulling up clothes, etc in standing.    OT Diagnosis:    OT Problem List:   OT Treatment Interventions:     OT Goals(Current goals can be found in the care plan section) Acute Rehab OT Goals Patient Stated Goal: be independent  Visit Information  Last OT Received On: 07/09/13 Assistance Needed: +1 History of Present Illness: R direct anterior THA       Prior Functioning     Home Living Family/patient expects to be discharged to:: Private residence Living Arrangements: Spouse/significant other Available Help at Discharge: Family Type of Home: House Home Access: Stairs to enter Secretary/administrator of Steps: 2 Entrance Stairs-Rails: None Home Layout: One level Home Equipment: Environmental consultant - 2 wheels Additional Comments: staying with daugther initially Prior Function Level of Independence: Independent with assistive device(s) Communication Communication: No difficulties Dominant Hand: Right         Vision/Perception     Cognition  Cognition Arousal/Alertness: Awake/alert Behavior During Therapy: WFL for tasks assessed/performed Overall Cognitive Status: Within Functional Limits for tasks assessed    Extremity/Trunk Assessment Upper Extremity Assessment Upper Extremity Assessment: Overall WFL for tasks assessed     Mobility Bed Mobility Bed Mobility: Supine to Sit Supine to Sit: 4: Min assist Transfers Transfers: Sit to Stand;Stand to Sit Sit to Stand: 4: Min guard;With upper extremity assist;From bed;From chair/3-in-1 Stand to Sit: 4: Min guard;With upper extremity assist;To chair/3-in-1 Details for Transfer Assistance: verbal cues for hand placement and LE management.     Exercise     Balance  Balance Balance Assessed: Yes Dynamic Standing Balance Dynamic Standing - Level of Assistance: 4: Min assist   End of Session OT - End of Session Equipment Utilized During Treatment: Rolling walker Activity Tolerance: Patient tolerated treatment well Patient left: in chair;with call bell/phone within reach;with family/visitor present  GO     Lennox Laity [phone] 07/09/2013, 9:00 AM

## 2013-07-13 NOTE — Discharge Summary (Signed)
Physician Discharge Summary  Patient ID: Denise Hawkins MRN: [id number] DOB/AGE: June 13, 1941 72 y.o.  Admit date: 07/07/2013 Discharge date: 07/09/2013   Procedures:  Procedure(s) (LRB): RIGHT TOTAL HIP ARTHROPLASTY ANTERIOR APPROACH (Right)  Attending Physician:  Dr. Durene Romans   Admission Diagnoses:   Right hip OA / pain  Discharge Diagnoses:  Principal Problem:   S/P right THA, AA Active Problems:   Expected blood loss anemia   Overweight (BMI 25.0-29.9)  Past Medical History  Diagnosis Date  . PONV (postoperative nausea and vomiting)   . Hypertension   . GERD (gastroesophageal reflux disease)   . Anxiety   . Headache(784.0)     migraines  . Arthritis     HPI: Denise Hawkins, 72 y.o. female, has a history of pain and functional disability in the right hip(s) due to arthritis and patient has failed non-surgical conservative treatments for greater than 12 weeks to include NSAID's and/or analgesics, corticosteriod injections, use of assistive devices and activity modification. Onset of symptoms was gradual starting 1+ years ago with gradually worsening course since that time.The patient noted no past surgery on the right hip(s). Patient currently rates pain in the right hip at 9 out of 10 with activity. Patient has night pain, worsening of pain with activity and weight bearing, trendelenberg gait, pain that interfers with activities of daily living and pain with passive range of motion. Patient has evidence of periarticular osteophytes and joint space narrowing by imaging studies. This condition presents safety issues increasing the risk of falls. There is no current active infection. Risks, benefits and expectations were discussed with the patient. Risks including but not limited to the risk of anesthesia, blood clots, nerve damage, blood vessel damage, failure of the prosthesis, infection and up to and including death. Patient understand the risks, benefits and expectations  and wishes to proceed with surgery.   PCP: Default, Provider, MD   Discharged Condition: good  Hospital Course:  Patient underwent the above stated procedure on 07/07/2013. Patient tolerated the procedure well and brought to the recovery room in good condition and subsequently to the floor.  POD #1 BP: 117/74 ; Pulse: 63 ; Temp: 97.7 F (36.5 C) ; Resp: 14  Pt's foley was removed. IV was changed to a saline lock. Patient reports pain as mild, pain controlled. No events throughout the night. Neurovascular intact, dorsiflexion/plantar flexion intact, incision: dressing C/D/I, no cellulitis present and compartment soft.   LABS  Basename    HGB  10.9  HCT  33.0   POD #2  BP: 116/70 ; Pulse: 64 ; Temp: 97.9 F (36.6 C) ; Resp: 16 Patient reports pain as mild, pain well controlled. No events throughout the night. Ready to be discharged home.  Neurovascular intact, dorsiflexion/plantar flexion intact, incision: dressing C/D/I, no cellulitis present and compartment soft.   LABS  Basename    HGB  9.9  HCT  30.1    Discharge Exam: General appearance: alert, cooperative and no distress Extremities: Homans sign is negative, no sign of DVT, no edema, redness or tenderness in the calves or thighs and no ulcers, gangrene or trophic changes  Disposition:    Home-Health Care Svc with follow up in 2 weeks   Follow-up Information   Follow up with Shelda Pal, MD. Schedule an appointment as soon as possible for a visit in 2 weeks.   Specialty:  Orthopedic Surgery   Contact information:   [address] [place] [address] [phone]  Discharge Orders   Future Orders Complete By Expires   Call MD / Call 911  As directed    Comments:     If you experience chest pain or shortness of breath, CALL 911 and be transported to the hospital emergency room.  If you develope a fever above 101 F, pus (white drainage) or increased drainage or redness at the wound, or  calf pain, call your surgeon's office.   Change dressing  As directed    Comments:     Maintain surgical dressing for 10-14 days, then replace with 4x4 guaze and tape. Keep the area dry and clean.   Constipation Prevention  As directed    Comments:     Drink plenty of fluids.  Prune juice may be helpful.  You may use a stool softener, such as Colace (over the counter) 100 mg twice a day.  Use MiraLax (over the counter) for constipation as needed.   Diet - low sodium heart healthy  As directed    Discharge instructions  As directed    Comments:     Maintain surgical dressing for 10-14 days, then replace with gauze and tape. Keep the area dry and clean until follow up. Follow up in 2 weeks at [facility]. Call with any questions or concerns.   Driving restrictions  As directed    Comments:     No driving for 4 weeks   Increase activity slowly as tolerated  As directed    TED hose  As directed    Comments:     Use stockings (TED hose) for 2 weeks on both leg(s).  You may remove them at night for sleeping.   Weight bearing as tolerated  As directed    Questions:     Laterality:     Extremity:          Medication List    STOP taking these medications       HYDROcodone-acetaminophen 5-325 MG per tablet  Commonly known as:  NORCO/VICODIN  Replaced by:  HYDROcodone-acetaminophen 7.5-325 MG per tablet      TAKE these medications       amLODipine 5 MG tablet  Commonly known as:  NORVASC  Take 5 mg by mouth every morning.     atenolol 25 MG tablet  Commonly known as:  TENORMIN  Take 25 mg by mouth every morning.     calcium carbonate 750 MG chewable tablet  Commonly known as:  TUMS EX  Chew 4 tablets by mouth daily.     DSS 100 MG Caps  Take 100 mg by mouth 2 (two) times daily.     enalapril 20 MG tablet  Commonly known as:  VASOTEC  Take 20 mg by mouth 2 (two) times daily.     ferrous sulfate 325 (65 FE) MG tablet  Take 1 tablet (325 mg total) by mouth 3  (three) times daily after meals.     HYDROcodone-acetaminophen 7.5-325 MG per tablet  Commonly known as:  NORCO  Take 1-2 tablets by mouth every 4 (four) hours as needed for moderate pain.     LORazepam 0.5 MG tablet  Commonly known as:  ATIVAN  Take 0.5 mg by mouth every 8 (eight) hours as needed.     omeprazole 20 MG capsule  Commonly known as:  PRILOSEC  Take 20 mg by mouth daily.     polyethylene glycol packet  Commonly known as:  MIRALAX / GLYCOLAX  Take 17 g by mouth  2 (two) times daily.     rivaroxaban 10 MG Tabs tablet  Commonly known as:  XARELTO  Take 1 tablet (10 mg total) by mouth daily.     tiZANidine 4 MG tablet  Commonly known as:  ZANAFLEX  Take 1 tablet (4 mg total) by mouth every 6 (six) hours as needed for muscle spasms.         Signed: Anastasio Auerbach. Salle Brandle   PAC  07/13/2013, 9:44 AM

## 2016-11-09 DIAGNOSIS — I10 Essential (primary) hypertension: Secondary | ICD-10-CM | POA: Diagnosis not present

## 2016-11-09 DIAGNOSIS — Z23 Encounter for immunization: Secondary | ICD-10-CM | POA: Diagnosis not present

## 2016-11-09 DIAGNOSIS — F411 Generalized anxiety disorder: Secondary | ICD-10-CM | POA: Diagnosis not present

## 2016-12-05 DIAGNOSIS — H16223 Keratoconjunctivitis sicca, not specified as Sjogren's, bilateral: Secondary | ICD-10-CM | POA: Diagnosis not present

## 2016-12-05 DIAGNOSIS — H2511 Age-related nuclear cataract, right eye: Secondary | ICD-10-CM | POA: Diagnosis not present

## 2016-12-05 DIAGNOSIS — H40013 Open angle with borderline findings, low risk, bilateral: Secondary | ICD-10-CM | POA: Diagnosis not present

## 2016-12-05 DIAGNOSIS — I10 Essential (primary) hypertension: Secondary | ICD-10-CM | POA: Diagnosis not present

## 2016-12-05 DIAGNOSIS — H26492 Other secondary cataract, left eye: Secondary | ICD-10-CM | POA: Diagnosis not present

## 2016-12-28 DIAGNOSIS — H16223 Keratoconjunctivitis sicca, not specified as Sjogren's, bilateral: Secondary | ICD-10-CM | POA: Diagnosis not present

## 2016-12-28 DIAGNOSIS — H25811 Combined forms of age-related cataract, right eye: Secondary | ICD-10-CM | POA: Diagnosis not present

## 2016-12-28 DIAGNOSIS — H26492 Other secondary cataract, left eye: Secondary | ICD-10-CM | POA: Diagnosis not present

## 2016-12-28 DIAGNOSIS — H02831 Dermatochalasis of right upper eyelid: Secondary | ICD-10-CM | POA: Diagnosis not present

## 2017-01-07 DIAGNOSIS — H26492 Other secondary cataract, left eye: Secondary | ICD-10-CM | POA: Diagnosis not present

## 2017-01-25 DIAGNOSIS — H25811 Combined forms of age-related cataract, right eye: Secondary | ICD-10-CM | POA: Diagnosis not present

## 2017-01-25 DIAGNOSIS — H16223 Keratoconjunctivitis sicca, not specified as Sjogren's, bilateral: Secondary | ICD-10-CM | POA: Diagnosis not present

## 2017-01-25 DIAGNOSIS — H02834 Dermatochalasis of left upper eyelid: Secondary | ICD-10-CM | POA: Diagnosis not present

## 2017-01-25 DIAGNOSIS — H02831 Dermatochalasis of right upper eyelid: Secondary | ICD-10-CM | POA: Diagnosis not present

## 2017-02-07 DIAGNOSIS — I10 Essential (primary) hypertension: Secondary | ICD-10-CM | POA: Diagnosis not present

## 2017-02-13 DIAGNOSIS — H25811 Combined forms of age-related cataract, right eye: Secondary | ICD-10-CM | POA: Diagnosis not present

## 2017-02-14 DIAGNOSIS — H25811 Combined forms of age-related cataract, right eye: Secondary | ICD-10-CM | POA: Diagnosis not present

## 2017-04-30 DIAGNOSIS — Z23 Encounter for immunization: Secondary | ICD-10-CM | POA: Diagnosis not present

## 2017-05-20 DIAGNOSIS — B359 Dermatophytosis, unspecified: Secondary | ICD-10-CM | POA: Diagnosis not present

## 2017-07-31 DIAGNOSIS — K219 Gastro-esophageal reflux disease without esophagitis: Secondary | ICD-10-CM | POA: Diagnosis not present

## 2017-07-31 DIAGNOSIS — K2970 Gastritis, unspecified, without bleeding: Secondary | ICD-10-CM | POA: Diagnosis not present

## 2017-09-26 DIAGNOSIS — E7800 Pure hypercholesterolemia, unspecified: Secondary | ICD-10-CM | POA: Diagnosis not present

## 2017-09-26 DIAGNOSIS — R252 Cramp and spasm: Secondary | ICD-10-CM | POA: Diagnosis not present

## 2017-09-26 DIAGNOSIS — M4807 Spinal stenosis, lumbosacral region: Secondary | ICD-10-CM | POA: Diagnosis not present

## 2017-09-26 DIAGNOSIS — Z86018 Personal history of other benign neoplasm: Secondary | ICD-10-CM | POA: Diagnosis not present

## 2017-09-26 DIAGNOSIS — I10 Essential (primary) hypertension: Secondary | ICD-10-CM | POA: Diagnosis not present

## 2017-09-26 DIAGNOSIS — Z7689 Persons encountering health services in other specified circumstances: Secondary | ICD-10-CM | POA: Diagnosis not present

## 2017-09-26 DIAGNOSIS — K2950 Unspecified chronic gastritis without bleeding: Secondary | ICD-10-CM | POA: Diagnosis not present

## 2017-09-26 DIAGNOSIS — R600 Localized edema: Secondary | ICD-10-CM | POA: Diagnosis not present

## 2017-09-27 ENCOUNTER — Other Ambulatory Visit: Payer: Self-pay | Admitting: Family Medicine

## 2017-09-27 DIAGNOSIS — Z86018 Personal history of other benign neoplasm: Secondary | ICD-10-CM

## 2017-10-04 ENCOUNTER — Ambulatory Visit
Admission: RE | Admit: 2017-10-04 | Discharge: 2017-10-04 | Disposition: A | Discharge status: Home / Self Care | Payer: Medicare Other | Source: Ambulatory Visit | Attending: Family Medicine | Admitting: Family Medicine

## 2017-10-04 DIAGNOSIS — R221 Localized swelling, mass and lump, neck: Secondary | ICD-10-CM | POA: Diagnosis not present

## 2017-10-04 DIAGNOSIS — Z86018 Personal history of other benign neoplasm: Secondary | ICD-10-CM

## 2017-10-17 ENCOUNTER — Other Ambulatory Visit: Payer: Self-pay | Admitting: Family Medicine

## 2017-10-17 DIAGNOSIS — Z86018 Personal history of other benign neoplasm: Secondary | ICD-10-CM

## 2017-10-29 DIAGNOSIS — R600 Localized edema: Secondary | ICD-10-CM | POA: Diagnosis not present

## 2017-10-29 DIAGNOSIS — R252 Cramp and spasm: Secondary | ICD-10-CM | POA: Diagnosis not present

## 2017-10-29 DIAGNOSIS — I10 Essential (primary) hypertension: Secondary | ICD-10-CM | POA: Diagnosis not present

## 2017-10-29 DIAGNOSIS — K2950 Unspecified chronic gastritis without bleeding: Secondary | ICD-10-CM | POA: Diagnosis not present

## 2017-10-29 DIAGNOSIS — Z86018 Personal history of other benign neoplasm: Secondary | ICD-10-CM | POA: Diagnosis not present

## 2017-10-29 DIAGNOSIS — M4807 Spinal stenosis, lumbosacral region: Secondary | ICD-10-CM | POA: Diagnosis not present

## 2017-10-29 DIAGNOSIS — E7800 Pure hypercholesterolemia, unspecified: Secondary | ICD-10-CM | POA: Diagnosis not present

## 2017-10-30 ENCOUNTER — Other Ambulatory Visit: Payer: Medicare Other

## 2017-10-31 ENCOUNTER — Ambulatory Visit
Admission: RE | Admit: 2017-10-31 | Discharge: 2017-10-31 | Disposition: A | Discharge status: Home / Self Care | Payer: Medicare Other | Source: Ambulatory Visit | Attending: Family Medicine | Admitting: Family Medicine

## 2017-10-31 DIAGNOSIS — Z86018 Personal history of other benign neoplasm: Secondary | ICD-10-CM

## 2017-10-31 DIAGNOSIS — R221 Localized swelling, mass and lump, neck: Secondary | ICD-10-CM | POA: Diagnosis not present

## 2017-10-31 MED ORDER — IOPAMIDOL (ISOVUE-300) INJECTION 61%
75.0000 mL | Freq: Once | INTRAVENOUS | Status: AC | PRN
  Administered 2017-10-31: 75 mL via INTRAVENOUS

## 2017-11-04 DIAGNOSIS — K118 Other diseases of salivary glands: Secondary | ICD-10-CM | POA: Diagnosis not present

## 2017-11-04 DIAGNOSIS — Z86018 Personal history of other benign neoplasm: Secondary | ICD-10-CM | POA: Diagnosis not present

## 2017-11-05 DIAGNOSIS — Z86018 Personal history of other benign neoplasm: Secondary | ICD-10-CM | POA: Diagnosis not present

## 2017-11-05 DIAGNOSIS — G43C0 Periodic headache syndromes in child or adult, not intractable: Secondary | ICD-10-CM | POA: Diagnosis not present

## 2017-11-05 DIAGNOSIS — K2950 Unspecified chronic gastritis without bleeding: Secondary | ICD-10-CM | POA: Diagnosis not present

## 2017-11-05 DIAGNOSIS — I10 Essential (primary) hypertension: Secondary | ICD-10-CM | POA: Diagnosis not present

## 2017-11-05 DIAGNOSIS — M4807 Spinal stenosis, lumbosacral region: Secondary | ICD-10-CM | POA: Diagnosis not present

## 2017-11-05 DIAGNOSIS — R600 Localized edema: Secondary | ICD-10-CM | POA: Diagnosis not present

## 2017-11-05 DIAGNOSIS — R252 Cramp and spasm: Secondary | ICD-10-CM | POA: Diagnosis not present

## 2017-11-05 DIAGNOSIS — E7800 Pure hypercholesterolemia, unspecified: Secondary | ICD-10-CM | POA: Diagnosis not present

## 2017-11-14 ENCOUNTER — Other Ambulatory Visit: Payer: Self-pay | Admitting: Otolaryngology

## 2017-11-14 NOTE — Pre-Procedure Instructions (Signed)
CAMILLA SKEEN  11/14/2017      [place], [place] [address] [place] [address] Phone: [phone] Fax: [phone]  [facility] [place], [place] - [place] AT [address] [place] [address] Phone: [phone] Fax: [phone]    Your procedure is scheduled on April 3  Report to [place] at 0900 A.M.  Call this number if you have problems the morning of surgery:  [phone]   Remember:  Do not eat food or drink liquids after midnight.  Continue all medications as directed by your physician except follow these medication instructions before surgery below    Take these medicines the morning of surgery with A SIP OF WATER  acetaminophen (TYLENOL)  amLODipine (NORVASC)  atenolol (TENORMIN) LORazepam (ATIVAN) omeprazole (PRILOSEC)  7 days prior to surgery STOP taking any Aspirin(unless otherwise instructed by your surgeon), Aleve, Naproxen, Ibuprofen, Motrin, Advil, Goody's, BC's, all herbal medications, fish oil, and all vitamins   Do not wear jewelry, make-up or nail polish.  Do not wear lotions, powders, or perfumes, or deodorant.  Do not shave 48 hours prior to surgery.   Do not bring valuables to the hospital.  [facility] is not responsible for any belongings or valuables.  Contacts, dentures or bridgework may not be worn into surgery.  Leave your suitcase in the car.  After surgery it may be brought to your room.  For patients admitted to the hospital, discharge time will be determined by your treatment team.  Patients discharged the day of surgery will not be allowed to drive home.    Special instructions:   [facility]- Preparing For Surgery  Before surgery, you can play an important role. Because skin is not sterile, your skin needs to be as free of germs as possible. You can reduce the number of germs  on your skin by washing with CHG (chlorahexidine gluconate) Soap before surgery.  CHG is an antiseptic cleaner which kills germs and bonds with the skin to continue killing germs even after washing.  Please do not use if you have an allergy to CHG or antibacterial soaps. If your skin becomes reddened/irritated stop using the CHG.  Do not shave (including legs and underarms) for at least 48 hours prior to first CHG shower. It is OK to shave your face.  Please follow these instructions carefully.   1. Shower the NIGHT BEFORE SURGERY and the MORNING OF SURGERY with CHG.   2. If you chose to wash your hair, wash your hair first as usual with your normal shampoo.  3. After you shampoo, rinse your hair and body thoroughly to remove the shampoo.  4. Use CHG as you would any other liquid soap. You can apply CHG directly to the skin and wash gently with a scrungie or a clean washcloth.   5. Apply the CHG Soap to your body ONLY FROM THE NECK DOWN.  Do not use on open wounds or open sores. Avoid contact with your eyes, ears, mouth and genitals (private parts). Wash Face and genitals (private parts)  with your normal soap.  6. Wash thoroughly, paying special attention to the area where your surgery will be performed.  7. Thoroughly rinse your body with warm water from the neck down.  8. DO NOT shower/wash with your normal soap after using and rinsing off the CHG Soap.  9. Pat yourself  dry with a CLEAN TOWEL.  10. Wear CLEAN PAJAMAS to bed the night before surgery, wear comfortable clothes the morning of surgery  11. Place CLEAN SHEETS on your bed the night of your first shower and DO NOT SLEEP WITH PETS.    Day of Surgery: Do not apply any deodorants/lotions. Please wear clean clothes to the hospital/surgery center.     Please read over the following fact sheets that you were given.

## 2017-11-15 ENCOUNTER — Encounter (HOSPITAL_COMMUNITY)
Admission: RE | Admit: 2017-11-15 | Discharge: 2017-11-15 | Disposition: A | Discharge status: Home / Self Care | Payer: Medicare Other | Source: Ambulatory Visit | Attending: Otolaryngology | Admitting: Otolaryngology

## 2017-11-15 ENCOUNTER — Other Ambulatory Visit: Payer: Self-pay

## 2017-11-15 ENCOUNTER — Other Ambulatory Visit (HOSPITAL_COMMUNITY): Payer: Medicare Other

## 2017-11-15 ENCOUNTER — Encounter (HOSPITAL_COMMUNITY): Payer: Self-pay

## 2017-11-15 DIAGNOSIS — Z01812 Encounter for preprocedural laboratory examination: Secondary | ICD-10-CM | POA: Insufficient documentation

## 2017-11-15 DIAGNOSIS — I10 Essential (primary) hypertension: Secondary | ICD-10-CM | POA: Diagnosis not present

## 2017-11-15 DIAGNOSIS — Z01810 Encounter for preprocedural cardiovascular examination: Secondary | ICD-10-CM | POA: Diagnosis not present

## 2017-11-15 LAB — BASIC METABOLIC PANEL
ANION GAP: 7 (ref 5–15)
BUN: 20 mg/dL (ref 6–20)
CHLORIDE: 104 mmol/L (ref 101–111)
CO2: 28 mmol/L (ref 22–32)
CREATININE: 1.11 mg/dL — AB (ref 0.44–1.00)
Calcium: 9.8 mg/dL (ref 8.9–10.3)
GFR calc non Af Amer: 46 mL/min — ABNORMAL LOW (ref >60)
GFR, EST AFRICAN AMERICAN: 54 mL/min — AB (ref >60)
Glucose, Bld: 98 mg/dL (ref 65–99)
POTASSIUM: 4.7 mmol/L (ref 3.5–5.1)
Sodium: 139 mmol/L (ref 135–145)

## 2017-11-15 LAB — CBC
HEMATOCRIT: 37.8 % (ref 36.0–46.0)
HEMOGLOBIN: 12 g/dL (ref 12.0–15.0)
MCH: 30.1 pg (ref 26.0–34.0)
MCHC: 31.7 g/dL (ref 30.0–36.0)
MCV: 94.7 fL (ref 78.0–100.0)
Platelets: 231 10*3/uL (ref 150–400)
RBC: 3.99 MIL/uL (ref 3.87–5.11)
RDW: 13 % (ref 11.5–15.5)
WBC: 8.7 10*3/uL (ref 4.0–10.5)

## 2017-11-15 NOTE — Progress Notes (Signed)
PCP - Ulanda Edison Cardiologist - denies  Chest x-ray - not needed EKG - 11/15/17 Stress Test - denies ECHO - denies Cardiac Cath - denies    Anesthesia review: NO  Patient denies shortness of breath, fever, cough and chest pain at PAT appointment   Patient verbalized understanding of instructions that were given to them at the PAT appointment. Patient was also instructed that they will need to review over the PAT instructions again at home before surgery.

## 2017-11-20 ENCOUNTER — Other Ambulatory Visit: Payer: Self-pay

## 2017-11-20 ENCOUNTER — Ambulatory Visit (HOSPITAL_COMMUNITY)
Admission: RE | Admit: 2017-11-20 | Discharge: 2017-11-21 | Disposition: A | Discharge status: Home / Self Care | Payer: Medicare Other | Source: Ambulatory Visit | Attending: Otolaryngology | Admitting: Otolaryngology

## 2017-11-20 ENCOUNTER — Encounter (HOSPITAL_COMMUNITY)
Admission: RE | Disposition: A | Discharge status: Home / Self Care | Payer: Self-pay | Source: Ambulatory Visit | Attending: Otolaryngology

## 2017-11-20 ENCOUNTER — Ambulatory Visit (HOSPITAL_COMMUNITY): Payer: Medicare Other

## 2017-11-20 ENCOUNTER — Encounter (HOSPITAL_COMMUNITY): Payer: Self-pay | Admitting: Anesthesiology

## 2017-11-20 DIAGNOSIS — D649 Anemia, unspecified: Secondary | ICD-10-CM | POA: Insufficient documentation

## 2017-11-20 DIAGNOSIS — Z96641 Presence of right artificial hip joint: Secondary | ICD-10-CM | POA: Diagnosis not present

## 2017-11-20 DIAGNOSIS — D117 Benign neoplasm of other major salivary glands: Secondary | ICD-10-CM | POA: Insufficient documentation

## 2017-11-20 DIAGNOSIS — M1990 Unspecified osteoarthritis, unspecified site: Secondary | ICD-10-CM | POA: Diagnosis not present

## 2017-11-20 DIAGNOSIS — I10 Essential (primary) hypertension: Secondary | ICD-10-CM | POA: Diagnosis not present

## 2017-11-20 DIAGNOSIS — Z91040 Latex allergy status: Secondary | ICD-10-CM | POA: Insufficient documentation

## 2017-11-20 DIAGNOSIS — K219 Gastro-esophageal reflux disease without esophagitis: Secondary | ICD-10-CM | POA: Diagnosis not present

## 2017-11-20 DIAGNOSIS — R220 Localized swelling, mass and lump, head: Secondary | ICD-10-CM

## 2017-11-20 DIAGNOSIS — F419 Anxiety disorder, unspecified: Secondary | ICD-10-CM | POA: Insufficient documentation

## 2017-11-20 DIAGNOSIS — Z880 Allergy status to penicillin: Secondary | ICD-10-CM | POA: Diagnosis not present

## 2017-11-20 DIAGNOSIS — K118 Other diseases of salivary glands: Secondary | ICD-10-CM | POA: Diagnosis not present

## 2017-11-20 DIAGNOSIS — G43909 Migraine, unspecified, not intractable, without status migrainosus: Secondary | ICD-10-CM | POA: Insufficient documentation

## 2017-11-20 DIAGNOSIS — Z882 Allergy status to sulfonamides status: Secondary | ICD-10-CM | POA: Insufficient documentation

## 2017-11-20 DIAGNOSIS — Z79899 Other long term (current) drug therapy: Secondary | ICD-10-CM | POA: Insufficient documentation

## 2017-11-20 DIAGNOSIS — Z86018 Personal history of other benign neoplasm: Secondary | ICD-10-CM | POA: Diagnosis not present

## 2017-11-20 HISTORY — PX: SUBMANDIBULAR GLAND EXCISION: SHX2456

## 2017-11-20 LAB — CBC
HCT: 35.6 % — ABNORMAL LOW (ref 36.0–46.0)
Hemoglobin: 11.7 g/dL — ABNORMAL LOW (ref 12.0–15.0)
MCH: 31 pg (ref 26.0–34.0)
MCHC: 32.9 g/dL (ref 30.0–36.0)
MCV: 94.4 fL (ref 78.0–100.0)
PLATELETS: 214 10*3/uL (ref 150–400)
RBC: 3.77 MIL/uL — AB (ref 3.87–5.11)
RDW: 13.1 % (ref 11.5–15.5)
WBC: 11.9 10*3/uL — ABNORMAL HIGH (ref 4.0–10.5)

## 2017-11-20 LAB — CREATININE, SERUM
CREATININE: 0.97 mg/dL (ref 0.44–1.00)
GFR calc Af Amer: >60 mL/min (ref >60)
GFR, EST NON AFRICAN AMERICAN: 55 mL/min — AB (ref >60)

## 2017-11-20 SURGERY — EXCISION, SUBMANDIBULAR GLAND
Anesthesia: General | Site: Neck | Laterality: Right

## 2017-11-20 MED ORDER — ROCURONIUM BROMIDE 100 MG/10ML IV SOLN
INTRAVENOUS | Status: DC | PRN
  Administered 2017-11-20: 50 mg via INTRAVENOUS

## 2017-11-20 MED ORDER — PROPOFOL 10 MG/ML IV BOLUS
INTRAVENOUS | Status: DC | PRN
  Administered 2017-11-20: 120 mg via INTRAVENOUS

## 2017-11-20 MED ORDER — DEXTROSE IN LACTATED RINGERS 5 % IV SOLN
INTRAVENOUS | Status: DC
  Administered 2017-11-20: 17:00:00 via INTRAVENOUS

## 2017-11-20 MED ORDER — MIDAZOLAM HCL 5 MG/5ML IJ SOLN
INTRAMUSCULAR | Status: DC | PRN
  Administered 2017-11-20: 2 mg via INTRAVENOUS

## 2017-11-20 MED ORDER — FENTANYL CITRATE (PF) 250 MCG/5ML IJ SOLN
INTRAMUSCULAR | Status: AC
  Filled 2017-11-20: qty 5

## 2017-11-20 MED ORDER — AMLODIPINE BESYLATE 2.5 MG PO TABS
2.5000 mg | ORAL_TABLET | Freq: Every day | ORAL | Status: DC
  Administered 2017-11-21: 2.5 mg via ORAL
  Filled 2017-11-20: qty 1

## 2017-11-20 MED ORDER — 0.9 % SODIUM CHLORIDE (POUR BTL) OPTIME
TOPICAL | Status: DC | PRN
  Administered 2017-11-20: 1000 mL

## 2017-11-20 MED ORDER — LIDOCAINE-EPINEPHRINE 1 %-1:100000 IJ SOLN
INTRAMUSCULAR | Status: DC | PRN
  Administered 2017-11-20: 2 mL

## 2017-11-20 MED ORDER — LIDOCAINE HCL (CARDIAC) 20 MG/ML IV SOLN
INTRAVENOUS | Status: DC | PRN
  Administered 2017-11-20: 80 mg via INTRAVENOUS

## 2017-11-20 MED ORDER — PANTOPRAZOLE SODIUM 40 MG PO TBEC
40.0000 mg | DELAYED_RELEASE_TABLET | Freq: Every day | ORAL | Status: DC
  Administered 2017-11-21: 40 mg via ORAL
  Filled 2017-11-20: qty 1

## 2017-11-20 MED ORDER — ONDANSETRON HCL 4 MG PO TABS: 4.0000 mg | ORAL_TABLET | ORAL | Status: DC | PRN

## 2017-11-20 MED ORDER — MIDAZOLAM HCL 2 MG/2ML IJ SOLN
INTRAMUSCULAR | Status: AC
  Filled 2017-11-20: qty 2

## 2017-11-20 MED ORDER — LORAZEPAM 0.5 MG PO TABS
0.5000 mg | ORAL_TABLET | Freq: Two times a day (BID) | ORAL | Status: DC | PRN
  Administered 2017-11-20: 0.5 mg via ORAL
  Filled 2017-11-20: qty 1

## 2017-11-20 MED ORDER — LACTATED RINGERS IV SOLN
INTRAVENOUS | Status: DC
  Administered 2017-11-20 (×2): via INTRAVENOUS

## 2017-11-20 MED ORDER — ENALAPRIL MALEATE 20 MG PO TABS
20.0000 mg | ORAL_TABLET | Freq: Two times a day (BID) | ORAL | Status: DC
  Administered 2017-11-20 – 2017-11-21 (×2): 20 mg via ORAL
  Filled 2017-11-20 (×2): qty 1

## 2017-11-20 MED ORDER — METOCLOPRAMIDE HCL 5 MG/ML IJ SOLN
10.0000 mg | Freq: Once | INTRAMUSCULAR | Status: AC | PRN
  Administered 2017-11-20: 10 mg via INTRAVENOUS

## 2017-11-20 MED ORDER — PROPOFOL 10 MG/ML IV BOLUS
INTRAVENOUS | Status: AC
  Filled 2017-11-20: qty 20

## 2017-11-20 MED ORDER — MORPHINE SULFATE (PF) 4 MG/ML IV SOLN: 2.0000 mg | INTRAVENOUS | Status: DC | PRN

## 2017-11-20 MED ORDER — FENTANYL CITRATE (PF) 100 MCG/2ML IJ SOLN
25.0000 ug | INTRAMUSCULAR | Status: DC | PRN
  Administered 2017-11-20: 50 ug via INTRAVENOUS

## 2017-11-20 MED ORDER — FENTANYL CITRATE (PF) 100 MCG/2ML IJ SOLN
INTRAMUSCULAR | Status: AC
  Filled 2017-11-20: qty 2

## 2017-11-20 MED ORDER — CEFAZOLIN SODIUM-DEXTROSE 2-4 GM/100ML-% IV SOLN
INTRAVENOUS | Status: AC
  Filled 2017-11-20: qty 100

## 2017-11-20 MED ORDER — DEXAMETHASONE SODIUM PHOSPHATE 10 MG/ML IJ SOLN
INTRAMUSCULAR | Status: DC | PRN
  Administered 2017-11-20: 10 mg via INTRAVENOUS

## 2017-11-20 MED ORDER — CEFAZOLIN SODIUM-DEXTROSE 2-3 GM-%(50ML) IV SOLR
INTRAVENOUS | Status: DC | PRN
  Administered 2017-11-20: 2 g via INTRAVENOUS

## 2017-11-20 MED ORDER — HEPARIN SODIUM (PORCINE) 5000 UNIT/ML IJ SOLN
5000.0000 [IU] | Freq: Three times a day (TID) | INTRAMUSCULAR | Status: DC
  Administered 2017-11-20 – 2017-11-21 (×2): 5000 [IU] via SUBCUTANEOUS
  Filled 2017-11-20 (×2): qty 1

## 2017-11-20 MED ORDER — SUGAMMADEX SODIUM 200 MG/2ML IV SOLN
INTRAVENOUS | Status: AC
  Filled 2017-11-20: qty 2

## 2017-11-20 MED ORDER — ATENOLOL 25 MG PO TABS
25.0000 mg | ORAL_TABLET | Freq: Every morning | ORAL | Status: DC
  Administered 2017-11-21: 25 mg via ORAL
  Filled 2017-11-20: qty 1

## 2017-11-20 MED ORDER — ACETAMINOPHEN 500 MG PO TABS
ORAL_TABLET | ORAL | Status: AC
  Filled 2017-11-20: qty 1

## 2017-11-20 MED ORDER — LIDOCAINE-EPINEPHRINE 1 %-1:100000 IJ SOLN
INTRAMUSCULAR | Status: AC
  Filled 2017-11-20: qty 1

## 2017-11-20 MED ORDER — BACITRACIN ZINC 500 UNIT/GM EX OINT
TOPICAL_OINTMENT | CUTANEOUS | Status: AC
  Filled 2017-11-20: qty 28.35

## 2017-11-20 MED ORDER — ONDANSETRON HCL 4 MG/2ML IJ SOLN
INTRAMUSCULAR | Status: DC | PRN
  Administered 2017-11-20: 4 mg via INTRAVENOUS

## 2017-11-20 MED ORDER — METOCLOPRAMIDE HCL 5 MG/ML IJ SOLN
INTRAMUSCULAR | Status: AC
  Filled 2017-11-20: qty 2

## 2017-11-20 MED ORDER — EPHEDRINE SULFATE-NACL 50-0.9 MG/10ML-% IV SOSY
PREFILLED_SYRINGE | INTRAVENOUS | Status: DC | PRN
  Administered 2017-11-20: 5 mg via INTRAVENOUS
  Administered 2017-11-20 (×2): 10 mg via INTRAVENOUS
  Administered 2017-11-20: 5 mg via INTRAVENOUS

## 2017-11-20 MED ORDER — DEXAMETHASONE SODIUM PHOSPHATE 10 MG/ML IJ SOLN
INTRAMUSCULAR | Status: AC
  Filled 2017-11-20: qty 1

## 2017-11-20 MED ORDER — LIDOCAINE HCL (CARDIAC) 20 MG/ML IV SOLN
INTRAVENOUS | Status: AC
  Filled 2017-11-20: qty 5

## 2017-11-20 MED ORDER — IBUPROFEN 200 MG PO TABS
200.0000 mg | ORAL_TABLET | Freq: Three times a day (TID) | ORAL | Status: DC | PRN
  Administered 2017-11-20: 200 mg via ORAL
  Filled 2017-11-20: qty 1

## 2017-11-20 MED ORDER — ACETAMINOPHEN 500 MG PO TABS
500.0000 mg | ORAL_TABLET | Freq: Four times a day (QID) | ORAL | Status: DC | PRN
  Administered 2017-11-20 (×2): 500 mg via ORAL
  Filled 2017-11-20 (×3): qty 1

## 2017-11-20 MED ORDER — ROCURONIUM BROMIDE 10 MG/ML (PF) SYRINGE
PREFILLED_SYRINGE | INTRAVENOUS | Status: AC
  Filled 2017-11-20: qty 5

## 2017-11-20 MED ORDER — SUCRALFATE 1 G PO TABS
1.0000 g | ORAL_TABLET | Freq: Three times a day (TID) | ORAL | Status: DC
  Administered 2017-11-20 – 2017-11-21 (×3): 1 g via ORAL
  Filled 2017-11-20 (×3): qty 1

## 2017-11-20 MED ORDER — ONDANSETRON HCL 4 MG/2ML IJ SOLN
4.0000 mg | INTRAMUSCULAR | Status: DC | PRN
  Administered 2017-11-20 (×2): 4 mg via INTRAVENOUS
  Filled 2017-11-20 (×3): qty 2

## 2017-11-20 MED ORDER — HYDROCODONE-ACETAMINOPHEN 5-325 MG PO TABS
1.0000 | ORAL_TABLET | ORAL | Status: DC | PRN
  Administered 2017-11-20 (×2): 1 via ORAL
  Filled 2017-11-20: qty 2
  Filled 2017-11-20: qty 1

## 2017-11-20 MED ORDER — MEPERIDINE HCL 50 MG/ML IJ SOLN: 6.2500 mg | INTRAMUSCULAR | Status: DC | PRN

## 2017-11-20 MED ORDER — HYDROCODONE-ACETAMINOPHEN 5-325 MG PO TABS: 1.0000 | ORAL_TABLET | Freq: Four times a day (QID) | ORAL | 0 refills | Status: AC | PRN

## 2017-11-20 MED ORDER — GLYCOPYRROLATE 0.2 MG/ML IJ SOLN
INTRAMUSCULAR | Status: DC | PRN
  Administered 2017-11-20: 0.2 mg via INTRAVENOUS

## 2017-11-20 MED ORDER — EPHEDRINE 5 MG/ML INJ
INTRAVENOUS | Status: AC
  Filled 2017-11-20: qty 10

## 2017-11-20 MED ORDER — HYDROCHLOROTHIAZIDE 12.5 MG PO CAPS
12.5000 mg | ORAL_CAPSULE | Freq: Every day | ORAL | Status: DC
  Administered 2017-11-21: 12.5 mg via ORAL
  Filled 2017-11-20: qty 1

## 2017-11-20 MED ORDER — SUGAMMADEX SODIUM 200 MG/2ML IV SOLN
INTRAVENOUS | Status: DC | PRN
  Administered 2017-11-20: 200 mg via INTRAVENOUS

## 2017-11-20 MED ORDER — FENTANYL CITRATE (PF) 100 MCG/2ML IJ SOLN
INTRAMUSCULAR | Status: DC | PRN
  Administered 2017-11-20: 50 ug via INTRAVENOUS
  Administered 2017-11-20: 100 ug via INTRAVENOUS
  Administered 2017-11-20: 50 ug via INTRAVENOUS

## 2017-11-20 MED ORDER — ONDANSETRON HCL 4 MG/2ML IJ SOLN
INTRAMUSCULAR | Status: AC
  Filled 2017-11-20: qty 2

## 2017-11-20 SURGICAL SUPPLY — 58 items
ATTRACTOMAT 16X20 MAGNETIC DRP (DRAPES) IMPLANT
BLADE SURG 15 STRL LF DISP TIS (BLADE) IMPLANT
BLADE SURG 15 STRL SS (BLADE)
CANISTER SUCT 3000ML PPV (MISCELLANEOUS) ×3 IMPLANT
CLEANER TIP ELECTROSURG 2X2 (MISCELLANEOUS) ×3 IMPLANT
CONT SPEC 4OZ CLIKSEAL STRL BL (MISCELLANEOUS) ×3 IMPLANT
CORDS BIPOLAR (ELECTRODE) ×3 IMPLANT
COVER SURGICAL LIGHT HANDLE (MISCELLANEOUS) ×3 IMPLANT
DERMABOND ADVANCED (GAUZE/BANDAGES/DRESSINGS) ×2
DERMABOND ADVANCED .7 DNX12 (GAUZE/BANDAGES/DRESSINGS) ×1 IMPLANT
DRAIN CHANNEL 7F 3/4 FLAT (WOUND CARE) IMPLANT
DRAIN HEMOVAC 7FR (DRAIN) ×3 IMPLANT
DRAIN PENROSE 1/4X12 LTX STRL (WOUND CARE) IMPLANT
DRAIN SNY 10 ROU (WOUND CARE) IMPLANT
DRAPE HALF SHEET 40X57 (DRAPES) IMPLANT
DRAPE ORTHO SPLIT 77X108 STRL (DRAPES) ×2
DRAPE SURG 17X23 STRL (DRAPES) ×3 IMPLANT
DRAPE SURG ORHT 6 SPLT 77X108 (DRAPES) ×1 IMPLANT
ELECT COATED BLADE 2.86 ST (ELECTRODE) ×3 IMPLANT
ELECT PAIRED SUBDERMAL (MISCELLANEOUS)
ELECT REM PT RETURN 9FT ADLT (ELECTROSURGICAL) ×3
ELECTRODE PAIRED SUBDERMAL (MISCELLANEOUS) IMPLANT
ELECTRODE REM PT RTRN 9FT ADLT (ELECTROSURGICAL) ×1 IMPLANT
EVACUATOR SILICONE 100CC (DRAIN) IMPLANT
FORCEPS BIPOLAR SPETZLER 8 1.0 (NEUROSURGERY SUPPLIES) ×3 IMPLANT
GLOVE BIOGEL M 7.0 STRL (GLOVE) ×3 IMPLANT
GOWN STRL REUS W/ TWL LRG LVL3 (GOWN DISPOSABLE) ×2 IMPLANT
GOWN STRL REUS W/TWL LRG LVL3 (GOWN DISPOSABLE) ×4
KIT BASIN OR (CUSTOM PROCEDURE TRAY) ×3 IMPLANT
KIT TURNOVER KIT B (KITS) ×3 IMPLANT
LOCATOR NERVE 3 VOLT (DISPOSABLE) IMPLANT
NEEDLE HYPO 25GX1X1/2 BEV (NEEDLE) ×3 IMPLANT
NS IRRIG 1000ML POUR BTL (IV SOLUTION) ×3 IMPLANT
PAD ARMBOARD 7.5X6 YLW CONV (MISCELLANEOUS) ×6 IMPLANT
PENCIL BUTTON HOLSTER BLD 10FT (ELECTRODE) ×3 IMPLANT
PROBE NERVBE PRASS .33 (MISCELLANEOUS) IMPLANT
SHEARS HARMONIC 9CM CVD (BLADE) IMPLANT
SPECIMEN JAR SMALL (MISCELLANEOUS) ×3 IMPLANT
SPONGE INTESTINAL PEANUT (DISPOSABLE) ×3 IMPLANT
STAPLER VISISTAT 35W (STAPLE) ×3 IMPLANT
SUT CHROMIC 3 0 PS 2 (SUTURE) IMPLANT
SUT ETHILON 2 0 FS 18 (SUTURE) ×3 IMPLANT
SUT ETHILON 3 0 PS 1 (SUTURE) IMPLANT
SUT ETHILON 4 0 PS 2 18 (SUTURE) ×3 IMPLANT
SUT ETHILON 5 0 P 3 18 (SUTURE)
SUT MON AB 5-0 P3 18 (SUTURE) ×3 IMPLANT
SUT NYLON ETHILON 5-0 P-3 1X18 (SUTURE) IMPLANT
SUT SILK 2 0 REEL (SUTURE) IMPLANT
SUT SILK 2 0 SH CR/8 (SUTURE) ×3 IMPLANT
SUT SILK 3 0 REEL (SUTURE) ×3 IMPLANT
SUT SILK 4 0 TIES 17X18 (SUTURE) IMPLANT
SUT VIC AB 3-0 FS2 27 (SUTURE) IMPLANT
SUT VIC AB 4-0 PS2 18 (SUTURE) ×3 IMPLANT
SUT VIC AB 5-0 P-3 18XBRD (SUTURE) ×1 IMPLANT
SUT VIC AB 5-0 P3 18 (SUTURE) ×2
SUT VICRYL 4-0 PS2 18IN ABS (SUTURE) IMPLANT
TRAY ENT MC OR (CUSTOM PROCEDURE TRAY) ×3 IMPLANT
WATER STERILE IRR 1000ML POUR (IV SOLUTION) ×3 IMPLANT

## 2017-11-20 NOTE — H&P (Signed)
Denise Hawkins is an 77 y.o. female.   Chief Complaint: Right Submandibular Mass HPI: Hx of enlarging  Right Submandibular Mass  Past Medical History:  Diagnosis Date  . Anxiety   . Arthritis   . GERD (gastroesophageal reflux disease)   . Headache(784.0)    migraines  . Hypertension   . PONV (postoperative nausea and vomiting)     Past Surgical History:  Procedure Laterality Date  . APPENDECTOMY    . BACK SURGERY    . BREAST SURGERY     right biopsy-benign  . COLONOSCOPY    . DILATION AND CURETTAGE OF UTERUS     x4  . ESOPHAGOGASTRODUODENOSCOPY    . EYE SURGERY     left with lens implant  . TONSILLECTOMY    . TOTAL HIP ARTHROPLASTY Right 07/07/2013   Procedure: RIGHT TOTAL HIP ARTHROPLASTY ANTERIOR APPROACH;  Surgeon: Mauri Pole, MD;  Location: [facility];  Service: Orthopedics;  Laterality: Right;    History reviewed. No pertinent family history. Social History:  reports that she has never smoked. She has never used smokeless tobacco. She reports that she does not drink alcohol or use drugs.  Allergies:  Allergies  Allergen Reactions  . Latex Rash    Blisters  . Penicillins Rash    Has patient had a PCN reaction causing immediate rash, facial/tongue/throat swelling, SOB or lightheadedness with hypotension: Yes Has patient had a PCN reaction causing severe rash involving mucus membranes or skin necrosis: Unknown Has patient had a PCN reaction that required hospitalization: No Has patient had a PCN reaction occurring within the last 10 years:No If all of the above answers are "NO", then may proceed with Cephalosporin use.   . Sulfa Antibiotics Rash    Medications Prior to Admission  Medication Sig Dispense Refill  . acetaminophen (TYLENOL) 500 MG tablet Take 500 mg by mouth every 6 (six) hours as needed (for pain.).    Marland Kitchen amLODipine (NORVASC) 2.5 MG tablet Take 2.5 mg by mouth daily.    Marland Kitchen atenolol (TENORMIN) 25 MG tablet Take 25 mg by mouth every morning.    .  Chlorpheniramine-DM (CORICIDIN HBP COUGH/COLD PO) Take 1 tablet by mouth every 6 (six) hours as needed (for cold/congestion.).    Marland Kitchen Docusate Sodium (PHILLIPS LIQUI-GELS PO) Take 1 capsule by mouth daily as needed (for constipation.).    Marland Kitchen enalapril (VASOTEC) 20 MG tablet Take 20 mg by mouth 2 (two) times daily.    . hydrochlorothiazide (MICROZIDE) 12.5 MG capsule Take 12.5 mg by mouth daily.    Marland Kitchen ibuprofen (ADVIL,MOTRIN) 200 MG tablet Take 200 mg by mouth every 8 (eight) hours as needed (for pain.).    Marland Kitchen LORazepam (ATIVAN) 0.5 MG tablet Take 0.5 mg by mouth 2 (two) times daily as needed (for anxiety/migraine headaches.).     Marland Kitchen omeprazole (PRILOSEC) 20 MG capsule Take 20 mg by mouth daily before breakfast.     . Probiotic Product (PROBIOTIC PO) Take 1 capsule by mouth daily.    . sucralfate (CARAFATE) 1 g tablet Take 1 g by mouth 4 (four) times daily -  before meals and at bedtime.      No results found for this or any previous visit (from the past 48 hour(s)). No results found.  Review of Systems  Constitutional: Negative.   HENT: Negative.   Respiratory: Negative.   Cardiovascular: Negative.     Blood pressure (!) 156/71, pulse (!) 59, temperature 98.3 F (36.8 C), temperature source Oral, height  5\' 8"  (1.727 m), weight 76.2 kg (168 lb), SpO2 99 %. Physical Exam  Constitutional: She appears well-developed and well-nourished.  HENT:  Head: Normocephalic.  Mouth/Throat: Oropharynx is clear and moist.  Neck: Normal range of motion. Neck supple.  Right submandibular mass  Cardiovascular: Normal rate.  Respiratory: Effort normal.     Assessment/Plan Adm for Excision  Right Submandibular Mass under GA   Talajah Slimp, MD 11/20/2017, 11:04 AM

## 2017-11-20 NOTE — Progress Notes (Addendum)
ENT Post Operative Note  Subjective: No nausea, vomiting No difficulty voiding Pain well controlled  Vitals:   11/20/17 1454 11/20/17 1530  BP: (!) 132/58 (!) 110/52  Pulse: 70 65  Resp: 16 16  Temp: (!) 97.2 F (36.2 C) (!) 97.5 F (36.4 C)  SpO2: 98%      OBJECTIVE  Gen: alert, cooperative, appropriate Head/ENT: EOMI, neck supple, mucus membranes moist and pink, conjunctiva clear Incisions c/d/i, facial nerve motion preserved. JP drain with minimal serosanguinous drainage Face moves symmetrically Respiratory: Voice without dysphonia. non-labored breathing, no accessory muscle use, normal HR, good O2 saturations    ASSESS/ PLAN  Denise Hawkins is a 77 y.o. female who is POD 0 from right submandibular mass excision.  -pain control -MIVF- will saline lock when tolerating PO intake -wound care, routine -Stays tonight, likely home in AM  Dr. Henderson Cloud [facility] [place] [place] Provider Office [phone]

## 2017-11-20 NOTE — Anesthesia Postprocedure Evaluation (Signed)
Anesthesia Post Note  Patient: Denise Hawkins  Procedure(s) Performed: EXCISION  OF RIGHT SUBMANDIBULAR GLAND (Right Neck)     Patient location during evaluation: PACU Anesthesia Type: General Level of consciousness: awake and alert and oriented Pain management: pain level controlled Vital Signs Assessment: post-procedure vital signs reviewed and stable Respiratory status: spontaneous breathing, nonlabored ventilation, respiratory function stable and patient connected to nasal cannula oxygen Cardiovascular status: blood pressure returned to baseline and stable Postop Assessment: no apparent nausea or vomiting Anesthetic complications: no    Last Vitals:  Vitals:   11/20/17 1339 11/20/17 1354  BP: 131/65 134/66  Pulse: 74 66  Resp: 11 11  Temp:    SpO2: 95% 100%    Last Pain:  Vitals:   11/20/17 1333  TempSrc:   PainSc: 9                  Macall Mccroskey A.

## 2017-11-20 NOTE — Transfer of Care (Signed)
Immediate Anesthesia Transfer of Care Note  Patient: Denise Hawkins  Procedure(s) Performed: EXCISION  OF RIGHT SUBMANDIBULAR GLAND (Right Neck)  Patient Location: PACU  Anesthesia Type:General  Level of Consciousness: awake, alert  and oriented  Airway & Oxygen Therapy: Patient Spontanous Breathing and Patient connected to face mask oxygen  Post-op Assessment: Report given to RN, Post -op Vital signs reviewed and stable and Patient moving all extremities X 4  Post vital signs: Reviewed and stable  Last Vitals:  Vitals Value Taken Time  BP 137/67 11/20/2017  1:24 PM  Temp    Pulse 82 11/20/2017  1:25 PM  Resp 21 11/20/2017  1:25 PM  SpO2 98 % 11/20/2017  1:25 PM  Vitals shown include unvalidated device data.  Last Pain:  Vitals:   11/20/17 0915  TempSrc: Oral  PainSc: 0-No pain         Complications: No apparent anesthesia complications

## 2017-11-20 NOTE — Progress Notes (Addendum)
1530 Received pt from PACU, A&O x4. Incision to right neck with skin glue dry and intact. JP drain in place. Pt c/o migraine headaches, medicated, with relief.

## 2017-11-20 NOTE — Anesthesia Preprocedure Evaluation (Signed)
Anesthesia Evaluation  Patient identified by MRN, date of birth, ID band Patient awake    Reviewed: Allergy & Precautions, NPO status , Patient's Chart, lab work & pertinent test results  History of Anesthesia Complications (+) PONV and history of anesthetic complications  Airway Mallampati: II  TM Distance: >3 FB Neck ROM: Full    Dental no notable dental hx. (+) Teeth Intact   Pulmonary neg pulmonary ROS,    breath sounds clear to auscultation       Cardiovascular hypertension, Pt. on home beta blockers and Pt. on medications Normal cardiovascular exam Rhythm:Regular Rate:Normal     Neuro/Psych  Headaches, Anxiety    GI/Hepatic Neg liver ROS, GERD  Medicated and Controlled,Mass submandibular gland   Endo/Other  negative endocrine ROS  Renal/GU negative Renal ROS  negative genitourinary   Musculoskeletal  (+) Arthritis , Osteoarthritis,    Abdominal   Peds  Hematology  (+) anemia ,   Anesthesia Other Findings   Reproductive/Obstetrics                             Anesthesia Physical Anesthesia Plan  ASA: II  Anesthesia Plan: General   Post-op Pain Management:    Induction: Intravenous  PONV Risk Score and Plan: 4 or greater and Ondansetron, Dexamethasone and Treatment may vary due to age or medical condition  Airway Management Planned: Nasal ETT  Additional Equipment:   Intra-op Plan:   Post-operative Plan: Extubation in OR  Informed Consent: I have reviewed the patients History and Physical, chart, labs and discussed the procedure including the risks, benefits and alternatives for the proposed anesthesia with the patient or authorized representative who has indicated his/her understanding and acceptance.   Dental advisory given  Plan Discussed with: CRNA, Anesthesiologist and Surgeon  Anesthesia Plan Comments:         Anesthesia Quick Evaluation

## 2017-11-20 NOTE — Anesthesia Procedure Notes (Signed)
Procedure Name: Intubation Date/Time: 11/20/2017 11:34 AM Performed by: Mariea Clonts, CRNA Pre-anesthesia Checklist: Patient identified, Emergency Drugs available, Suction available and Patient being monitored Patient Re-evaluated:Patient Re-evaluated prior to induction Oxygen Delivery Method: Circle System Utilized Preoxygenation: Pre-oxygenation with 100% oxygen Induction Type: IV induction Ventilation: Mask ventilation without difficulty Laryngoscope Size: Mac and 3 Grade View: Grade I Tube type: Oral Tube size: 7.0 mm Number of attempts: 1 Airway Equipment and Method: Stylet and Oral airway Placement Confirmation: ETT inserted through vocal cords under direct vision,  positive ETCO2 and breath sounds checked- equal and bilateral Secured at: 21 cm Tube secured with: Tape Dental Injury: Teeth and Oropharynx as per pre-operative assessment  Comments: Intubated without difficulty by Anette Hufham SRNA

## 2017-11-20 NOTE — Op Note (Signed)
Operative Note: EXCISION RIGHT SUBMANDIBULAR GLAND  Patient: Denise Hawkins  Medical record number: [id number]  Date:11/20/2017  Pre-operative Indications: Right submandibular mass  Postoperative Indications: Same  Surgical Procedure: Excision right submandibular gland  Anesthesia: GET  Surgeon: Delsa Bern, M.D.  Complications: None  EBL: Less than 50 cc   Brief History: The patient is a 77 y.o. 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  The patient reports a 30-month history of gradual enlargement and swelling with mild discomfort in the right submandibular region. Given the patient's history and findings I recommended excision right submandibular gland/mass under general anesthesia, risks and benefits were discussed in detail with the patient and her family. They understand and agree with our plan for surgery which is scheduled at [facility] on an electiveelective basis.  Surgical Procedure: The patient is brought to the operating room on 11/20/2017 and placed in supine position on the operating table. General endotracheal anesthesia was established without difficulty. When the patient was adequately anesthetized, surgical timeout was performed and correct identification of the patient and the surgical procedure. The patient was positioned and prepped and draped in sterile fashion.  Firm palpable mass in the right submandibular space was marked preoperatively, the overlying skin was then injected with 2 cc of 1% lidocaine 1 100,000 dilution epinephrine in a subcutaneous fashion.  After allowing adequate time for vasoconstriction hemostasis the procedure was begun by creating a 3 cm horizontally oriented skin incision in a pre-existing skin crease.  This is carried through the skin and underlying subcutaneous tissue, the platysma muscle was identified and then divided with  electrocautery.  Subplatysmal flaps were elevated superiorly and inferiorly.  The inferior margin of the submandibular gland was palpable submillimeter fascia was then divided and elevated, this included the marginal mandibular nerve which is identified and preserved throughout its course.  Deep to the submandibular gland the anterior and posterior bellies of the digastric muscle were identified the hypoglossal nerve was identified and preserved and the gland was elevated superiorly.  The lateral aspect of the gland was then carefully dissected from inferior to superior the vascular contribution to the gland were divided and suture-ligated. The lingual nerve was identified and nerve innervation to the gland was divided preserving the nerve in its entirety.  The posterior margin of the mylohyoid muscle was  then elevated and the submandibular duct was traced anteriorly, divided and suture ligated.  The entire gland and associated mass were resected and sent to pathology for gross and microscopic evaluation.  There were no significant adhesions and significant adhesions and the mass was entirely intracapsular. There is no evidence of lymphadenopathy.  Closure of the incision was undertaken in multiple layers after placement of a 7 [place] round drain at the base of the incision.  The platysma muscle was reapproximated with interrupted 4-0 Vicryl suture, deep subcutaneous tissue closed with 4-0 Vicryl suture and skin edge reapproximated with interrupted 5-0 Vicryl suture. Final skin closure with Dermabond surgical glue.    An orogastric tube was passed and stomach contents were aspirated. Patient was awakened from anesthetic and transferred from the operating room to the recovery room in stable condition. There were no complications and blood loss was < 50 cc.   Delsa Bern, M.D. [facility] 11/20/2017

## 2017-11-21 ENCOUNTER — Encounter (HOSPITAL_COMMUNITY): Payer: Self-pay | Admitting: Otolaryngology

## 2017-11-21 DIAGNOSIS — I10 Essential (primary) hypertension: Secondary | ICD-10-CM | POA: Diagnosis not present

## 2017-11-21 DIAGNOSIS — G43909 Migraine, unspecified, not intractable, without status migrainosus: Secondary | ICD-10-CM | POA: Diagnosis not present

## 2017-11-21 DIAGNOSIS — D117 Benign neoplasm of other major salivary glands: Secondary | ICD-10-CM | POA: Diagnosis not present

## 2017-11-21 DIAGNOSIS — F419 Anxiety disorder, unspecified: Secondary | ICD-10-CM | POA: Diagnosis not present

## 2017-11-21 DIAGNOSIS — K219 Gastro-esophageal reflux disease without esophagitis: Secondary | ICD-10-CM | POA: Diagnosis not present

## 2017-11-21 DIAGNOSIS — M1990 Unspecified osteoarthritis, unspecified site: Secondary | ICD-10-CM | POA: Diagnosis not present

## 2017-11-21 MED ORDER — HYDROCODONE-ACETAMINOPHEN 5-325 MG PO TABS: 1.0000 | ORAL_TABLET | ORAL | 0 refills | Status: DC | PRN

## 2017-11-21 NOTE — Progress Notes (Signed)
Boris Lown to be D/C'd  per MD order. Discussed with the patient and all questions fully answered.  VSS, Skin clean, dry and intact without evidence of skin break down, no evidence of skin tears noted.  IV catheter discontinued intact. Site without signs and symptoms of complications. Dressing and pressure applied.  An After Visit Summary was printed and given to the patient. Patient received prescription.  D/c education completed with patient/family including follow up instructions, medication list, d/c activities limitations if indicated, with other d/c instructions as indicated by MD - patient able to verbalize understanding, all questions fully answered.   Patient instructed to return to ED, call 911, or call MD for any changes in condition.   Patient to be escorted via [place], and D/C home via private auto.

## 2017-11-21 NOTE — Discharge Summary (Signed)
Physician Discharge Summary  Patient ID: Denise Hawkins MRN: [id number] DOB/AGE: 1940/11/04 77 y.o.  Admit date: 11/20/2017 Discharge date: 11/21/2017  Admission Diagnoses:  Principal Problem:   Mass of right submandibular region Active Problems:   Submandibular gland mass   Discharge Diagnoses:  Same  Surgeries: Procedure(s): EXCISION  OF RIGHT SUBMANDIBULAR GLAND on 11/20/2017   Consultants: None  Discharged Condition: Improved  Hospital Course: Denise Hawkins is an 77 y.o. female who was admitted 11/20/2017 with a diagnosis of Principal Problem:   Mass of right submandibular region Active Problems:   Submandibular gland mass  and went to the operating room on 11/20/2017 and underwent the above named procedures.   Pt stable and d/c'ed on POD#1.  Recent vital signs:  Vitals:   11/21/17 0124 11/21/17 0300  BP: (!) 110/54 109/62  Pulse: 66 82  Resp: 17 16  Temp: 97.8 F (36.6 C) 98.2 F (36.8 C)  SpO2: 96% 97%    Recent laboratory studies:  Results for orders placed or performed during the hospital encounter of 11/20/17  CBC  Result Value Ref Range   WBC 11.9 (H) 4.0 - 10.5 K/uL   RBC 3.77 (L) 3.87 - 5.11 MIL/uL   Hemoglobin 11.7 (L) 12.0 - 15.0 g/dL   HCT 35.6 (L) 36.0 - 46.0 %   MCV 94.4 78.0 - 100.0 fL   MCH 31.0 26.0 - 34.0 pg   MCHC 32.9 30.0 - 36.0 g/dL   RDW 13.1 11.5 - 15.5 %   Platelets 214 150 - 400 K/uL  Creatinine, serum  Result Value Ref Range   Creatinine, Ser 0.97 0.44 - 1.00 mg/dL   GFR calc non Af Amer 55 (L) >60 mL/min   GFR calc Af Amer >60 >60 mL/min    Discharge Medications:   Allergies as of 11/21/2017      Reactions   Latex Rash   Blisters   Penicillins Rash   Has patient had a PCN reaction causing immediate rash, facial/tongue/throat swelling, SOB or lightheadedness with hypotension: Yes Has patient had a PCN reaction causing severe rash involving mucus membranes or skin necrosis: Unknown Has patient had a PCN reaction that required  hospitalization: No Has patient had a PCN reaction occurring within the last 10 years:No If all of the above answers are "NO", then may proceed with Cephalosporin use.   Sulfa Antibiotics Rash      Medication List    TAKE these medications   acetaminophen 500 MG tablet Commonly known as:  TYLENOL Take 500 mg by mouth every 6 (six) hours as needed (for pain.).   amLODipine 2.5 MG tablet Commonly known as:  NORVASC Take 2.5 mg by mouth daily.   atenolol 25 MG tablet Commonly known as:  TENORMIN Take 25 mg by mouth every morning.   CORICIDIN HBP COUGH/COLD PO Take 1 tablet by mouth every 6 (six) hours as needed (for cold/congestion.).   enalapril 20 MG tablet Commonly known as:  VASOTEC Take 20 mg by mouth 2 (two) times daily.   hydrochlorothiazide 12.5 MG capsule Commonly known as:  MICROZIDE Take 12.5 mg by mouth daily.   HYDROcodone-acetaminophen 5-325 MG tablet Commonly known as:  NORCO Take 1-2 tablets by mouth every 6 (six) hours as needed for up to 5 days.   HYDROcodone-acetaminophen 5-325 MG tablet Commonly known as:  NORCO/VICODIN Take 1-2 tablets by mouth every 4 (four) hours as needed for moderate pain.   ibuprofen 200 MG tablet Commonly known as:  ADVIL,MOTRIN Take  200 mg by mouth every 8 (eight) hours as needed (for pain.).   LORazepam 0.5 MG tablet Commonly known as:  ATIVAN Take 0.5 mg by mouth 2 (two) times daily as needed (for anxiety/migraine headaches.).   omeprazole 20 MG capsule Commonly known as:  PRILOSEC Take 20 mg by mouth daily before breakfast.   PHILLIPS LIQUI-GELS PO Take 1 capsule by mouth daily as needed (for constipation.).   PROBIOTIC PO Take 1 capsule by mouth daily.   sucralfate 1 g tablet Commonly known as:  CARAFATE Take 1 g by mouth 4 (four) times daily -  before meals and at bedtime.       Diagnostic Studies: Ct Soft Tissue Neck W Contrast  Result Date: 10/31/2017 CLINICAL DATA:  Evaluate right submandibular mass.  History of pleomorphic adenoma resection. EXAM: CT NECK WITH CONTRAST TECHNIQUE: Multidetector CT imaging of the neck was performed using the standard protocol following the bolus administration of intravenous contrast. CONTRAST:  71mL ISOVUE-300 IOPAMIDOL (ISOVUE-300) INJECTION 61% COMPARISON:  Soft tissue ultrasound 10/04/2017 FINDINGS: Pharynx and larynx: Normal. No mass or swelling. Salivary glands: There is a mass along the inferior aspect of the submandibular gland with claw sign on coronal reformats. The mass has a solid predominantly hypoenhancing appearance. The mass measures up to 3.7 by 2.6 cm. There is concerning lobulation along the inferior and lateral margin. Thyroid: Normal. Lymph nodes: None enlarged or abnormal density. Vascular: Negative. Limited intracranial: Negative. Visualized orbits: Not covered Mastoids and visualized paranasal sinuses: Clear Skeleton: No acute or aggressive finding. Upper chest: Negative These results will be called to the ordering clinician or representative by the Radiologist Assistant, and communication documented in the PACS or zVision Dashboard. IMPRESSION: 3.7 cm right submandibular gland mass/neoplasm. Concerning mass lobulation inferiorly, a possible malignant feature. Recommend ENT referral. Electronically Signed   By: Monte Fantasia M.D.   On: 10/31/2017 11:39    Disposition: Discharge disposition: 01-Home or Self Care       Discharge Instructions    Diet - low sodium heart healthy   Complete by:  As directed    Diet - low sodium heart healthy   Complete by:  As directed    Discharge instructions   Complete by:  As directed    1. Limited activity 2. Liquid and soft diet, advance as tolerated 3. May bathe and shower day after surgery 4. Wound care - Gentle cleaning with soap and water 5. DO NOT APPLY ANY OINTMENT 6. Elevate Head of Bed   Increase activity slowly   Complete by:  As directed    Increase activity slowly   Complete by:  As  directed       Follow-up Information    Jerrell Belfast, MD In 10 days.   Specialty:  Otolaryngology Contact information: [address] [place] [address] [phone]            Signed: Jerrell Belfast 11/21/2017, 11:49 AM

## 2017-11-21 NOTE — Progress Notes (Signed)
   ENT Progress Note: POD #1 s/p Procedure(s): EXCISION  OF RIGHT SUBMANDIBULAR GLAND   Subjective: Nausea resolved, min pain  Objective: Vital signs in last 24 hours: Temp:  [97.2 F (36.2 C)-98.9 F (37.2 C)] 98.2 F (36.8 C) (04/04 0300) Pulse Rate:  [61-82] 82 (04/04 0300) Resp:  [8-17] 16 (04/04 0300) BP: (100-140)/(52-68) 109/62 (04/04 0300) SpO2:  [95 %-100 %] 97 % (04/04 0300) Weight change:  Last BM Date: 11/20/17  Intake/Output from previous day: 04/03 0701 - 04/04 0700 In: 2426.3 [P.O.:480; I.V.:1946.3] Out: 60 [Drains:20; Blood:40] Intake/Output this shift: Total I/O In: 623.8 [P.O.:240; I.V.:383.8] Out: -   Labs: Recent Labs    11/20/17 1534  WBC 11.9*  HGB 11.7*  HCT 35.6*  PLT 214   No results for input(s): NA, K, CL, CO2, GLUCOSE, BUN, CALCIUM in the last 72 hours.  Invalid input(s): CREATININR  Studies/Results: No results found.   PHYSICAL EXAM: JP removed Min marginal weakness Inc intact   Assessment/Plan: D/C to home     Denise Hawkins 11/21/2017, 11:40 AM

## 2018-02-28 DIAGNOSIS — M4807 Spinal stenosis, lumbosacral region: Secondary | ICD-10-CM | POA: Diagnosis not present

## 2018-02-28 DIAGNOSIS — F4322 Adjustment disorder with anxiety: Secondary | ICD-10-CM | POA: Diagnosis not present

## 2018-02-28 DIAGNOSIS — G43C0 Periodic headache syndromes in child or adult, not intractable: Secondary | ICD-10-CM | POA: Diagnosis not present

## 2018-02-28 DIAGNOSIS — I10 Essential (primary) hypertension: Secondary | ICD-10-CM | POA: Diagnosis not present

## 2018-02-28 DIAGNOSIS — K2950 Unspecified chronic gastritis without bleeding: Secondary | ICD-10-CM | POA: Diagnosis not present

## 2018-02-28 DIAGNOSIS — R252 Cramp and spasm: Secondary | ICD-10-CM | POA: Diagnosis not present

## 2018-02-28 DIAGNOSIS — E7800 Pure hypercholesterolemia, unspecified: Secondary | ICD-10-CM | POA: Diagnosis not present

## 2018-02-28 DIAGNOSIS — R600 Localized edema: Secondary | ICD-10-CM | POA: Diagnosis not present

## 2018-02-28 DIAGNOSIS — Z86018 Personal history of other benign neoplasm: Secondary | ICD-10-CM | POA: Diagnosis not present

## 2018-05-03 IMAGING — US US SOFT TISSUE HEAD/NECK
1 series · 14 of 15 positions shown · non-contrast
Comparison: None.

CLINICAL DATA: Enlarging palpable mass of the right submandibular
region and history of pleomorphic adenoma the salivary gland.

EXAM:
ULTRASOUND OF HEAD/NECK SOFT TISSUES
TECHNIQUE: Ultrasound examination of the head and neck soft tissues was
performed in the area of clinical concern.

[Series 1: us soft tissue head/neck · 0.06mm/px · 15 acquisitions, 14 frames shown]
[im 1/15]
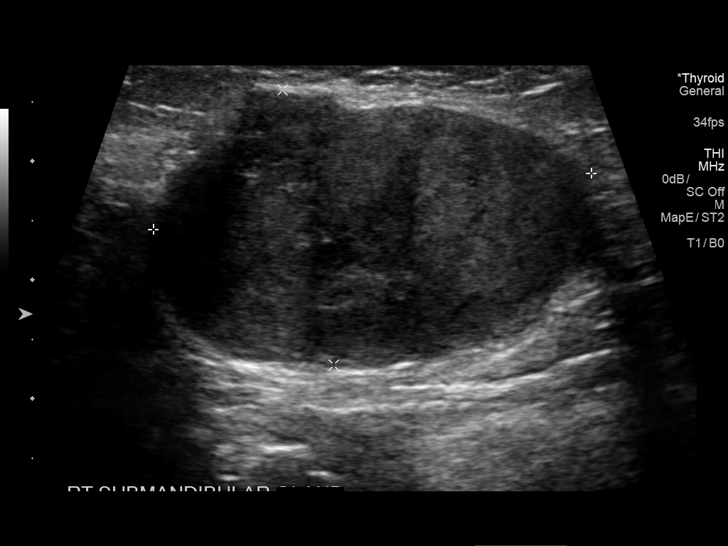
[im 2/15]
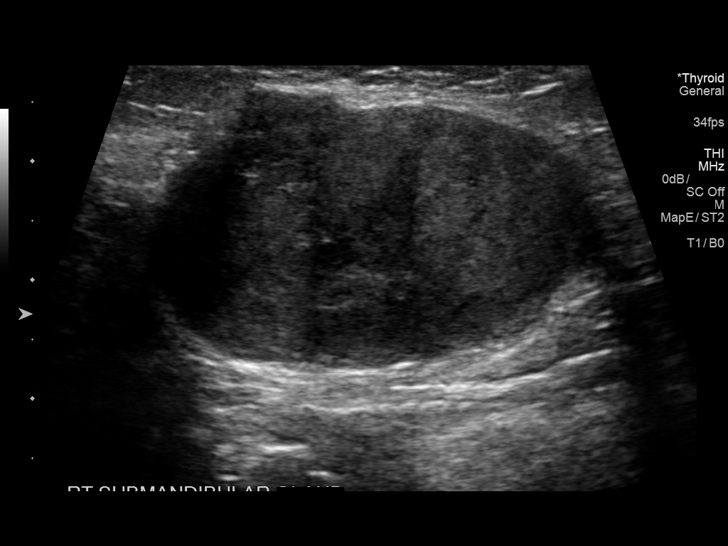
[im 3/15]
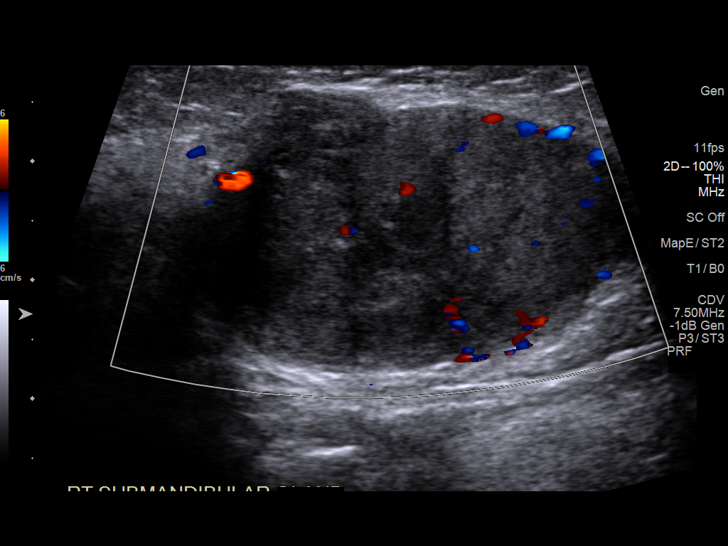
[im 4/15]
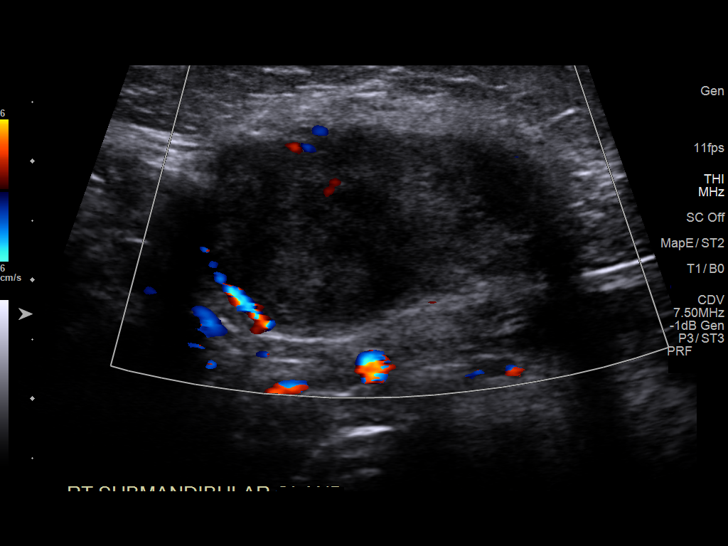
[im 5/15]
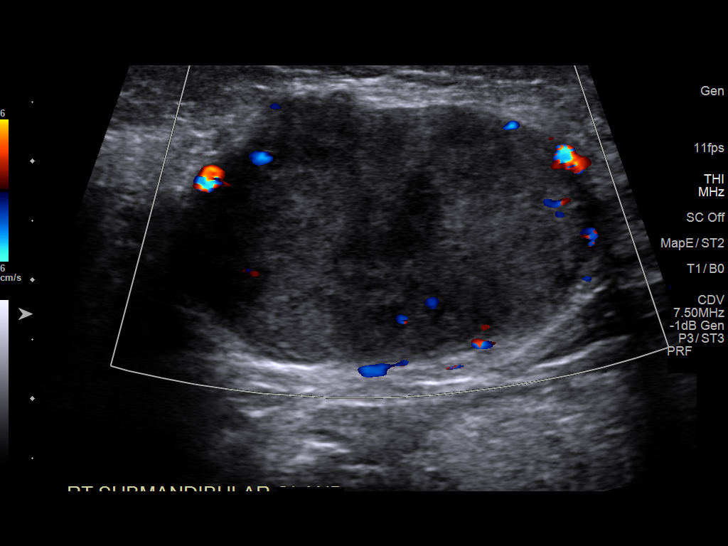
[im 6/15]
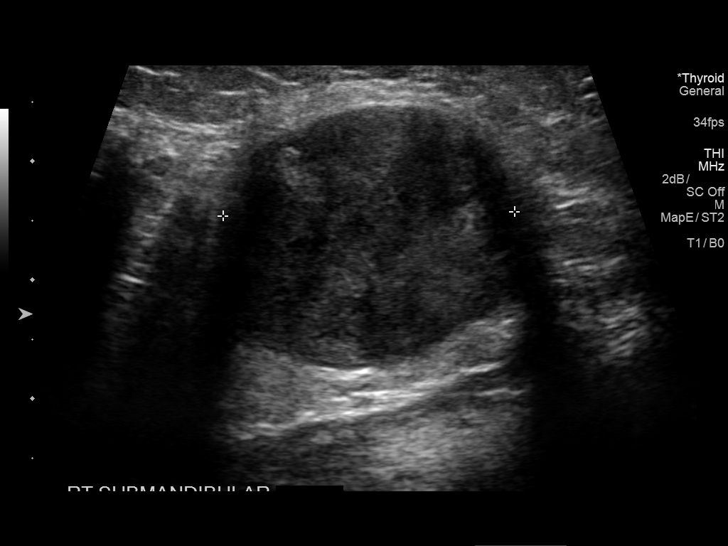
[im 7/15]
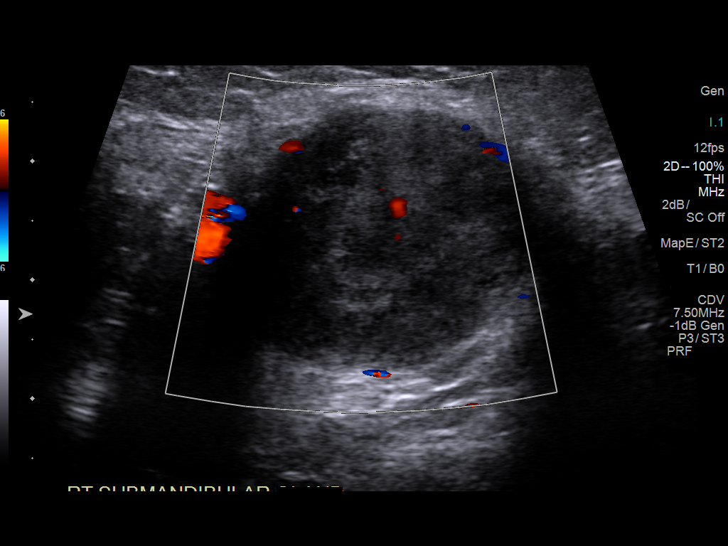
[im 9/15]
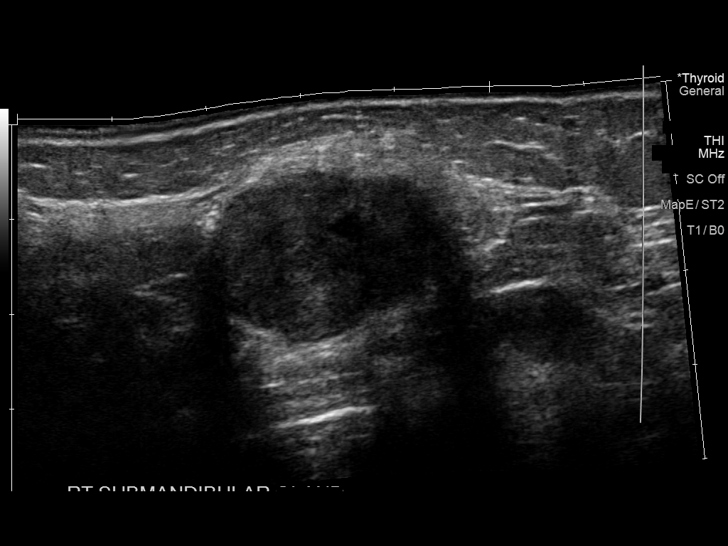
[im 10/15]
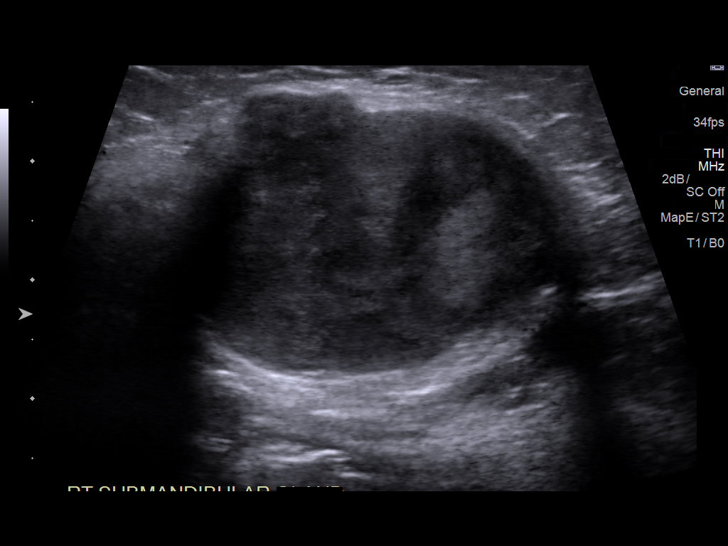
[im 11/15]
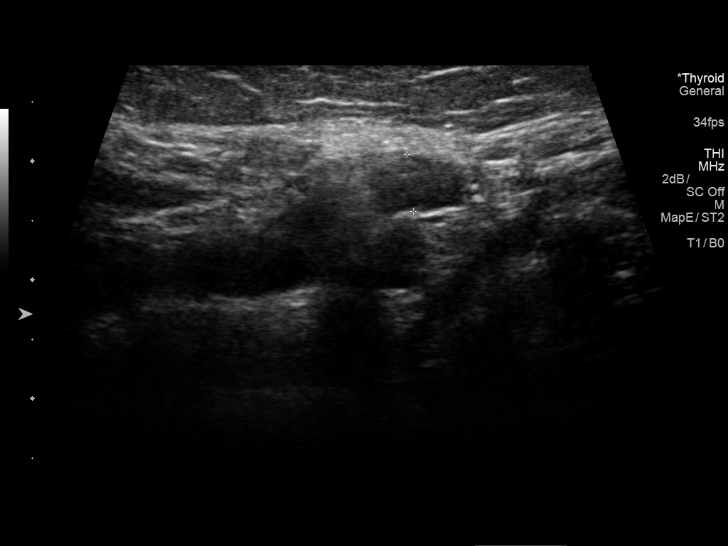
[im 12/15]
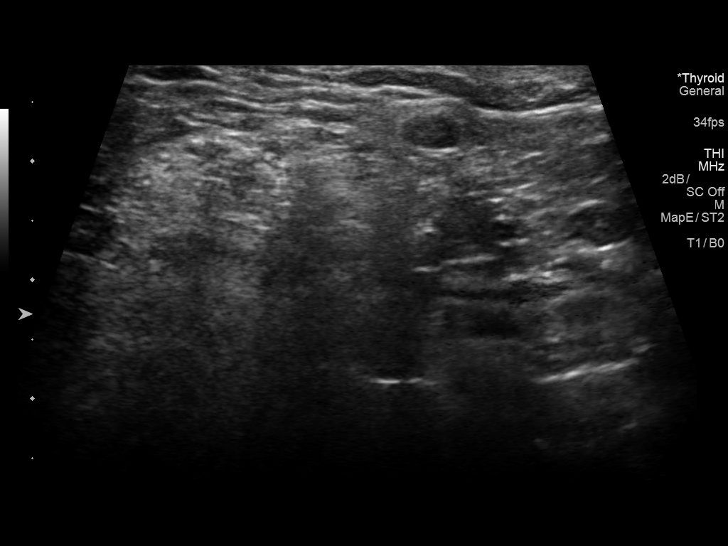
[im 13/15]
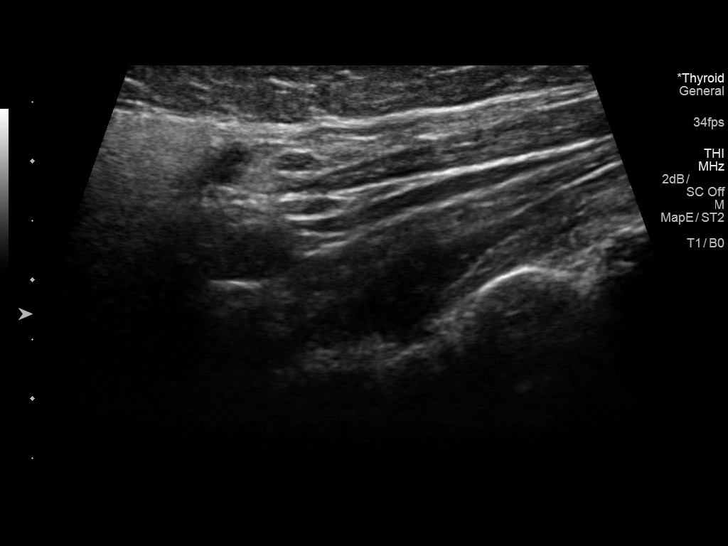
[im 14/15]
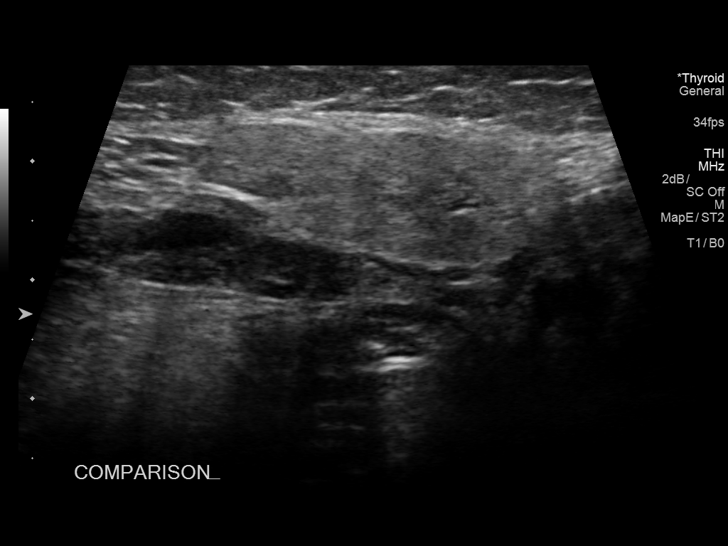
[im 15/15]
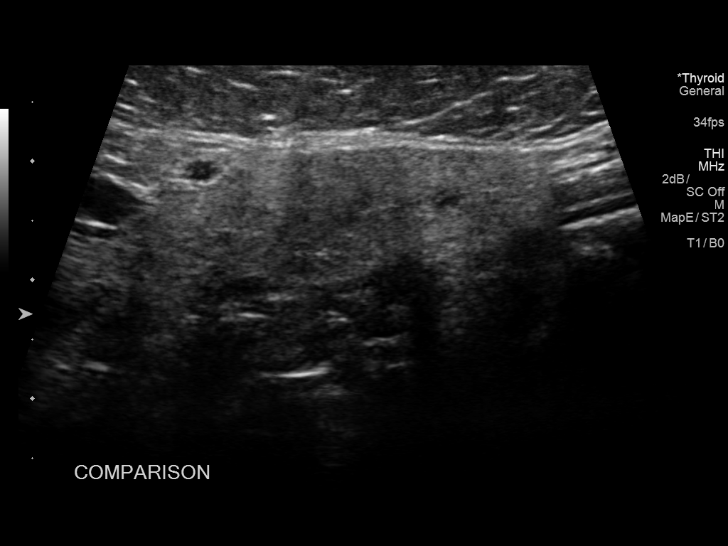

[14 of 15 positions shown; findings below may reference images not displayed]

FINDINGS: In the right submandibular region, a hypoechoic solid mass is
identified measuring approximately 3.7 x 2.4 x 2.5 cm. It is
difficult to determine whether this is arising from salivary tissue
or may represent a separate lymph node mass. This appears likely
pathologic and suspicious for malignancy. Further clarification of
location and characterization of the mass is recommended with CT of
the neck with contrast prior to determining whether to approach this
with percutaneous biopsy versus surgical excision.
IMPRESSION: Large solid mass in the right submandibular region measuring 3.7 x
2.4 x 2.5 cm. By ultrasound it is difficult to determine whether
this is of salivary origin or a lymph node mass. CT of the neck with
contrast is recommended for further characterization.

## 2018-07-03 DIAGNOSIS — M4807 Spinal stenosis, lumbosacral region: Secondary | ICD-10-CM | POA: Diagnosis not present

## 2018-07-03 DIAGNOSIS — G43C0 Periodic headache syndromes in child or adult, not intractable: Secondary | ICD-10-CM | POA: Diagnosis not present

## 2018-07-03 DIAGNOSIS — K2950 Unspecified chronic gastritis without bleeding: Secondary | ICD-10-CM | POA: Diagnosis not present

## 2018-07-03 DIAGNOSIS — F4322 Adjustment disorder with anxiety: Secondary | ICD-10-CM | POA: Diagnosis not present

## 2018-07-03 DIAGNOSIS — E7800 Pure hypercholesterolemia, unspecified: Secondary | ICD-10-CM | POA: Diagnosis not present

## 2018-07-03 DIAGNOSIS — R252 Cramp and spasm: Secondary | ICD-10-CM | POA: Diagnosis not present

## 2018-07-03 DIAGNOSIS — Z86018 Personal history of other benign neoplasm: Secondary | ICD-10-CM | POA: Diagnosis not present

## 2018-07-03 DIAGNOSIS — R600 Localized edema: Secondary | ICD-10-CM | POA: Diagnosis not present

## 2018-07-03 DIAGNOSIS — I10 Essential (primary) hypertension: Secondary | ICD-10-CM | POA: Diagnosis not present

## 2018-07-03 DIAGNOSIS — H6982 Other specified disorders of Eustachian tube, left ear: Secondary | ICD-10-CM | POA: Diagnosis not present

## 2018-10-23 DIAGNOSIS — I10 Essential (primary) hypertension: Secondary | ICD-10-CM | POA: Diagnosis not present

## 2018-10-23 DIAGNOSIS — R600 Localized edema: Secondary | ICD-10-CM | POA: Diagnosis not present

## 2018-10-23 DIAGNOSIS — Z91013 Allergy to seafood: Secondary | ICD-10-CM | POA: Diagnosis not present

## 2018-10-23 DIAGNOSIS — K2950 Unspecified chronic gastritis without bleeding: Secondary | ICD-10-CM | POA: Diagnosis not present

## 2018-10-23 DIAGNOSIS — F4322 Adjustment disorder with anxiety: Secondary | ICD-10-CM | POA: Diagnosis not present

## 2018-10-23 DIAGNOSIS — E7800 Pure hypercholesterolemia, unspecified: Secondary | ICD-10-CM | POA: Diagnosis not present

## 2018-10-23 DIAGNOSIS — Z86018 Personal history of other benign neoplasm: Secondary | ICD-10-CM | POA: Diagnosis not present

## 2018-10-23 DIAGNOSIS — M4807 Spinal stenosis, lumbosacral region: Secondary | ICD-10-CM | POA: Diagnosis not present

## 2018-10-23 DIAGNOSIS — G43C0 Periodic headache syndromes in child or adult, not intractable: Secondary | ICD-10-CM | POA: Diagnosis not present

## 2018-10-23 DIAGNOSIS — R252 Cramp and spasm: Secondary | ICD-10-CM | POA: Diagnosis not present

## 2018-10-23 DIAGNOSIS — H6982 Other specified disorders of Eustachian tube, left ear: Secondary | ICD-10-CM | POA: Diagnosis not present

## 2018-11-11 DIAGNOSIS — R7989 Other specified abnormal findings of blood chemistry: Secondary | ICD-10-CM | POA: Diagnosis not present

## 2019-04-28 DIAGNOSIS — K2950 Unspecified chronic gastritis without bleeding: Secondary | ICD-10-CM | POA: Diagnosis not present

## 2019-04-28 DIAGNOSIS — F4322 Adjustment disorder with anxiety: Secondary | ICD-10-CM | POA: Diagnosis not present

## 2019-04-28 DIAGNOSIS — M4807 Spinal stenosis, lumbosacral region: Secondary | ICD-10-CM | POA: Diagnosis not present

## 2019-04-28 DIAGNOSIS — I10 Essential (primary) hypertension: Secondary | ICD-10-CM | POA: Diagnosis not present

## 2019-04-28 DIAGNOSIS — E7800 Pure hypercholesterolemia, unspecified: Secondary | ICD-10-CM | POA: Diagnosis not present

## 2019-04-28 DIAGNOSIS — Z86018 Personal history of other benign neoplasm: Secondary | ICD-10-CM | POA: Diagnosis not present

## 2019-06-10 DIAGNOSIS — R110 Nausea: Secondary | ICD-10-CM | POA: Diagnosis not present

## 2019-06-10 DIAGNOSIS — G43909 Migraine, unspecified, not intractable, without status migrainosus: Secondary | ICD-10-CM | POA: Diagnosis not present

## 2019-06-18 ENCOUNTER — Other Ambulatory Visit (HOSPITAL_BASED_OUTPATIENT_CLINIC_OR_DEPARTMENT_OTHER): Payer: Self-pay | Admitting: Family Medicine

## 2019-06-18 DIAGNOSIS — R1013 Epigastric pain: Secondary | ICD-10-CM

## 2019-06-19 ENCOUNTER — Ambulatory Visit (HOSPITAL_BASED_OUTPATIENT_CLINIC_OR_DEPARTMENT_OTHER)
Admission: RE | Admit: 2019-06-19 | Discharge: 2019-06-19 | Disposition: A | Discharge status: Home / Self Care | Payer: Medicare Other | Source: Ambulatory Visit | Attending: Family Medicine | Admitting: Family Medicine

## 2019-06-19 ENCOUNTER — Other Ambulatory Visit: Payer: Self-pay

## 2019-06-19 DIAGNOSIS — R1013 Epigastric pain: Secondary | ICD-10-CM | POA: Diagnosis not present

## 2019-07-23 DIAGNOSIS — R1013 Epigastric pain: Secondary | ICD-10-CM | POA: Diagnosis not present

## 2019-07-23 DIAGNOSIS — Z8719 Personal history of other diseases of the digestive system: Secondary | ICD-10-CM | POA: Diagnosis not present

## 2019-07-28 ENCOUNTER — Other Ambulatory Visit: Payer: Self-pay | Admitting: Gastroenterology

## 2019-07-28 DIAGNOSIS — R1013 Epigastric pain: Secondary | ICD-10-CM

## 2019-08-03 ENCOUNTER — Other Ambulatory Visit: Payer: Self-pay | Admitting: Gastroenterology

## 2019-08-03 DIAGNOSIS — R109 Unspecified abdominal pain: Secondary | ICD-10-CM

## 2019-08-03 DIAGNOSIS — R1013 Epigastric pain: Secondary | ICD-10-CM

## 2019-08-06 ENCOUNTER — Other Ambulatory Visit: Payer: Medicare Other

## 2019-08-11 ENCOUNTER — Ambulatory Visit
Admission: RE | Admit: 2019-08-11 | Discharge: 2019-08-11 | Disposition: A | Discharge status: Home / Self Care | Payer: Medicare Other | Source: Ambulatory Visit | Attending: Gastroenterology | Admitting: Gastroenterology

## 2019-08-11 ENCOUNTER — Other Ambulatory Visit: Payer: Medicare Other

## 2019-08-11 ENCOUNTER — Other Ambulatory Visit: Payer: Self-pay

## 2019-08-11 DIAGNOSIS — R109 Unspecified abdominal pain: Secondary | ICD-10-CM

## 2019-08-11 DIAGNOSIS — R1013 Epigastric pain: Secondary | ICD-10-CM

## 2019-08-11 MED ORDER — IOPAMIDOL (ISOVUE-300) INJECTION 61%
100.0000 mL | Freq: Once | INTRAVENOUS | Status: AC | PRN
  Administered 2019-08-11: 14:00:00 100 mL via INTRAVENOUS

## 2019-08-24 ENCOUNTER — Institutional Professional Consult (permissible substitution): Payer: Medicare Other | Admitting: Internal Medicine

## 2019-09-03 ENCOUNTER — Ambulatory Visit (INDEPENDENT_AMBULATORY_CARE_PROVIDER_SITE_OTHER): Payer: PPO | Admitting: Critical Care Medicine

## 2019-09-03 ENCOUNTER — Encounter: Payer: Self-pay | Admitting: Critical Care Medicine

## 2019-09-03 ENCOUNTER — Other Ambulatory Visit: Payer: Self-pay

## 2019-09-03 VITALS — BP 160/80 | HR 60 | Temp 98.6°F | Ht 66.5 in | Wt 155.2 lb

## 2019-09-03 DIAGNOSIS — R918 Other nonspecific abnormal finding of lung field: Secondary | ICD-10-CM | POA: Diagnosis not present

## 2019-09-03 NOTE — Patient Instructions (Addendum)
Thank you for visiting Dr. Carlis Abbott at [facility].  We recommend the following: Orders Placed This Encounter  Procedures  . CT Chest Wo Contrast   Orders Placed This Encounter  Procedures  . CT Chest Wo Contrast    March 2021    Standing Status:   Future    Standing Expiration Date:   10/31/2020    Order Specific Question:   ** REASON FOR EXAM (FREE TEXT)    Answer:   follow up [facility] in [facility]    Order Specific Question:   Preferred imaging location?    Answer:   [facility]    Order Specific Question:   Radiology Contrast Protocol - do NOT remove file path    Answer:   [email or web address]      Return in about 2 months (around 11/01/2019).    Please do your part to reduce the spread of COVID-19.

## 2019-09-03 NOTE — Progress Notes (Signed)
Synopsis: Referred in January 2021 for abnormal imaging by Vernie Shanks, MD.  Subjective:   PATIENT ID: Denise Hawkins GENDER: female DOB: June 14, 1941, MRN: [id number]  Chief Complaint  Patient presents with  . Pulmoanry Consult    Ms. Denise Hawkins is a 79 y/o woman referred for evaluation of an abnormal CT scan. She had an abdominal CT scan performed to evaluate several weeks of abdominal pain and bloating. The lung bases raised concern for possible chronic infectious process. She denies SOB, cough, sputum production, fevers, chills, sweats. She has had more fatigue that normal for about the past 6 months. She has lost about 10# over the past few months that she attributes to poor PO intake from her stomach issues. She has follow up with her gastroenterologist again soon. She has a history of gastritis associated with nausea and stomach burning. Her current symptoms including abdominal and chest pain associated with gas, frequent belching, nausea, and stomach burning. She denies heartburn or the sensation of reflux. She has been taking her PPI.  She has no personal or family history of lung disease or immune deficits. No previous pneumonias. She is a never smoker, but had significant 2nd hand smoke exposure from her husband. She is UTD on her seasonal flu shot.     Past Medical History:  Diagnosis Date  . Anxiety   . Arthritis   . GERD (gastroesophageal reflux disease)   . Headache(784.0)    migraines  . Hypertension   . PONV (postoperative nausea and vomiting)      No family history on file.   Past Surgical History:  Procedure Laterality Date  . APPENDECTOMY    . BACK SURGERY    . BREAST SURGERY     right biopsy-benign  . COLONOSCOPY    . DILATION AND CURETTAGE OF UTERUS     x4  . ESOPHAGOGASTRODUODENOSCOPY    . EYE SURGERY     left with lens implant  . SUBMANDIBULAR GLAND EXCISION Right 11/20/2017   Procedure: EXCISION  OF RIGHT SUBMANDIBULAR GLAND;  Surgeon: Jerrell Belfast, MD;  Location: [facility];  Service: ENT;  Laterality: Right;  . TONSILLECTOMY    . TOTAL HIP ARTHROPLASTY Right 07/07/2013   Procedure: RIGHT TOTAL HIP ARTHROPLASTY ANTERIOR APPROACH;  Surgeon: Mauri Pole, MD;  Location: [facility];  Service: Orthopedics;  Laterality: Right;    Social History   Socioeconomic History  . Marital status: Married    Spouse name: Not on file  . Number of children: Not on file  . Years of education: Not on file  . Highest education level: Not on file  Occupational History  . Not on file  Tobacco Use  . Smoking status: Never Smoker  . Smokeless tobacco: Never Used  Substance and Sexual Activity  . Alcohol use: No  . Drug use: No  . Sexual activity: Not on file  Other Topics Concern  . Not on file  Social History Narrative  . Not on file   Social Determinants of Health   Financial Resource Strain:   . Difficulty of Paying Living Expenses: Not on file  Food Insecurity:   . Worried About Charity fundraiser in the Last Year: Not on file  . Ran Out of Food in the Last Year: Not on file  Transportation Needs:   . Lack of Transportation (Medical): Not on file  . Lack of Transportation (Non-Medical): Not on file  Physical Activity:   . Days of Exercise per  Week: Not on file  . Minutes of Exercise per Session: Not on file  Stress:   . Feeling of Stress : Not on file  Social Connections:   . Frequency of Communication with Friends and Family: Not on file  . Frequency of Social Gatherings with Friends and Family: Not on file  . Attends Religious Services: Not on file  . Active Member of Clubs or Organizations: Not on file  . Attends Archivist Meetings: Not on file  . Marital Status: Not on file  Intimate Partner Violence:   . Fear of Current or Ex-Partner: Not on file  . Emotionally Abused: Not on file  . Physically Abused: Not on file  . Sexually Abused: Not on file     Allergies  Allergen Reactions  . Latex Rash    Blisters    . Penicillins Rash    Has patient had a PCN reaction causing immediate rash, facial/tongue/throat swelling, SOB or lightheadedness with hypotension: Yes Has patient had a PCN reaction causing severe rash involving mucus membranes or skin necrosis: Unknown Has patient had a PCN reaction that required hospitalization: No Has patient had a PCN reaction occurring within the last 10 years:No If all of the above answers are "NO", then may proceed with Cephalosporin use.   . Sulfa Antibiotics Rash      There is no immunization history on file for this patient.  Outpatient Medications Prior to Visit  Medication Sig Dispense Refill  . acetaminophen (TYLENOL) 500 MG tablet Take 500 mg by mouth every 6 (six) hours as needed (for pain.).    Marland Kitchen amLODipine (NORVASC) 2.5 MG tablet Take 2.5 mg by mouth daily.    Marland Kitchen atenolol (TENORMIN) 25 MG tablet Take 25 mg by mouth every morning.    . Chlorpheniramine-DM (CORICIDIN HBP COUGH/COLD PO) Take 1 tablet by mouth every 6 (six) hours as needed (for cold/congestion.).    Marland Kitchen Docusate Sodium (PHILLIPS LIQUI-GELS PO) Take 1 capsule by mouth daily as needed (for constipation.).    Marland Kitchen enalapril (VASOTEC) 20 MG tablet Take 20 mg by mouth 2 (two) times daily.    . hydrochlorothiazide (MICROZIDE) 12.5 MG capsule Take 12.5 mg by mouth daily.    Marland Kitchen HYDROcodone-acetaminophen (NORCO/VICODIN) 5-325 MG tablet Take 1-2 tablets by mouth every 4 (four) hours as needed for moderate pain. 30 tablet 0  . ibuprofen (ADVIL,MOTRIN) 200 MG tablet Take 200 mg by mouth every 8 (eight) hours as needed (for pain.).    Marland Kitchen LORazepam (ATIVAN) 0.5 MG tablet Take 0.5 mg by mouth 2 (two) times daily as needed (for anxiety/migraine headaches.).     Marland Kitchen omeprazole (PRILOSEC) 20 MG capsule Take 20 mg by mouth daily before breakfast.     . Probiotic Product (PROBIOTIC PO) Take 1 capsule by mouth daily.    . sucralfate (CARAFATE) 1 g tablet Take 1 g by mouth 4 (four) times daily -  before meals and at  bedtime.     No facility-administered medications prior to visit.    Review of Systems  Constitutional: Positive for malaise/fatigue and weight loss. Negative for chills and fever.  HENT: Negative for congestion.   Respiratory: Negative for cough, sputum production, shortness of breath and wheezing.   Cardiovascular: Positive for chest pain.  Gastrointestinal: Positive for abdominal pain and nausea. Negative for heartburn.     Objective:  There were no vitals filed for this visit.   on   RA BMI Readings from Last 3 Encounters:  11/20/17 25.54  kg/m  11/15/17 25.89 kg/m  07/07/13 26.00 kg/m   Wt Readings from Last 3 Encounters:  11/20/17 168 lb (76.2 kg)  11/15/17 170 lb 4.8 oz (77.2 kg)  07/07/13 171 lb (77.6 kg)    Physical Exam Vitals reviewed.  Constitutional:      General: She is not in acute distress.    Appearance: Normal appearance. She is not ill-appearing.     Comments: Frail-appearing elderly woman   HENT:     Head: Normocephalic and atraumatic.     Nose:     Comments: Deferred due to masking requirement.    Mouth/Throat:     Comments: Deferred due to masking requirement. Eyes:     General: No scleral icterus. Cardiovascular:     Rate and Rhythm: Normal rate and regular rhythm.  Pulmonary:     Comments: Breathing comfortably on RA, CTAB. No conversational dyspnea. Abdominal:     General: There is no distension.     Palpations: Abdomen is soft.  Musculoskeletal:        General: No swelling or deformity.     Cervical back: Neck supple.  Lymphadenopathy:     Cervical: No cervical adenopathy.  Skin:    General: Skin is warm and dry.     Findings: No rash.  Neurological:     General: No focal deficit present.     Mental Status: She is alert.     Coordination: Coordination normal.  Psychiatric:        Mood and Affect: Mood normal.        Behavior: Behavior normal.      CBC    Component Value Date/Time   WBC 11.9 (H) 11/20/2017 1534   RBC  3.77 (L) 11/20/2017 1534   HGB 11.7 (L) 11/20/2017 1534   HCT 35.6 (L) 11/20/2017 1534   PLT 214 11/20/2017 1534   MCV 94.4 11/20/2017 1534   MCH 31.0 11/20/2017 1534   MCHC 32.9 11/20/2017 1534   RDW 13.1 11/20/2017 1534    CHEMISTRY No results for input(s): NA, K, CL, CO2, GLUCOSE, BUN, CREATININE, CALCIUM, MG, PHOS in the last 168 hours. CrCl cannot be calculated (Patient's most recent lab result is older than the maximum 21 days allowed.).   Chest Imaging- films reviewed: CT abdomen pelvis 08/11/2019-few scattered areas of groundglass, mild intralobular septal thickening throughout both lower lobes.  Airway thickening without bronchiectasis.  Posterior RML opacity, likely mucous plugging  CT neck 10/31/2017- emphysema, airway thickening, no bronchiectasis  Pulmonary Functions Testing Results: No flowsheet data found.      Assessment & Plan:     ICD-10-CM   1. Abnormal CT scan, lung  R91.8     Abnormal chest CT- GG nodules vs. Less likely chronic infection. Most likely these are inflammatory nodules, and may be related to her abdominal pain if she is having gastritis or frequent reflux. -Repeat CT chest after 3 months to determine if nodules persist. -If she begins to have SOB or produce sputum, it would be indicated to do an infectious workup, but her nodules are very subtle and may be explained by causes other than chronic infections. -Con't covid precautions- social distancing, mask wearing, hand washing. Recommend the covid vaccine.   RTC in 2 months after CT scan.   Current Outpatient Medications:  .  acetaminophen (TYLENOL) 500 MG tablet, Take 500 mg by mouth every 6 (six) hours as needed (for pain.)., Disp: , Rfl:  .  amLODipine (NORVASC) 2.5 MG tablet, Take 2.5 mg  by mouth daily., Disp: , Rfl:  .  atenolol (TENORMIN) 25 MG tablet, Take 25 mg by mouth every morning., Disp: , Rfl:  .  Chlorpheniramine-DM (CORICIDIN HBP COUGH/COLD PO), Take 1 tablet by mouth every 6  (six) hours as needed (for cold/congestion.)., Disp: , Rfl:  .  Docusate Sodium (PHILLIPS LIQUI-GELS PO), Take 1 capsule by mouth daily as needed (for constipation.)., Disp: , Rfl:  .  enalapril (VASOTEC) 20 MG tablet, Take 20 mg by mouth 2 (two) times daily., Disp: , Rfl:  .  hydrochlorothiazide (MICROZIDE) 12.5 MG capsule, Take 12.5 mg by mouth daily., Disp: , Rfl:  .  HYDROcodone-acetaminophen (NORCO/VICODIN) 5-325 MG tablet, Take 1-2 tablets by mouth every 4 (four) hours as needed for moderate pain., Disp: 30 tablet, Rfl: 0 .  ibuprofen (ADVIL,MOTRIN) 200 MG tablet, Take 200 mg by mouth every 8 (eight) hours as needed (for pain.)., Disp: , Rfl:  .  LORazepam (ATIVAN) 0.5 MG tablet, Take 0.5 mg by mouth 2 (two) times daily as needed (for anxiety/migraine headaches.). , Disp: , Rfl:  .  omeprazole (PRILOSEC) 20 MG capsule, Take 20 mg by mouth daily before breakfast. , Disp: , Rfl:  .  Probiotic Product (PROBIOTIC PO), Take 1 capsule by mouth daily., Disp: , Rfl:  .  sucralfate (CARAFATE) 1 g tablet, Take 1 g by mouth 4 (four) times daily -  before meals and at bedtime., Disp: , Rfl:    I spent 44 minutes on this encounter, including face to face time and non-face to face time spent reviewing records, charting, coordinating care, etc.   Julian Hy, DO [facility] 09/03/2019 7:56 AM

## 2019-09-04 DIAGNOSIS — Z8719 Personal history of other diseases of the digestive system: Secondary | ICD-10-CM | POA: Diagnosis not present

## 2019-09-04 DIAGNOSIS — R1013 Epigastric pain: Secondary | ICD-10-CM | POA: Diagnosis not present

## 2019-09-05 DIAGNOSIS — M79661 Pain in right lower leg: Secondary | ICD-10-CM | POA: Diagnosis not present

## 2019-09-05 DIAGNOSIS — M79604 Pain in right leg: Secondary | ICD-10-CM | POA: Diagnosis not present

## 2019-09-05 DIAGNOSIS — M79605 Pain in left leg: Secondary | ICD-10-CM | POA: Diagnosis not present

## 2019-09-05 DIAGNOSIS — L853 Xerosis cutis: Secondary | ICD-10-CM | POA: Diagnosis not present

## 2019-09-07 ENCOUNTER — Other Ambulatory Visit (HOSPITAL_BASED_OUTPATIENT_CLINIC_OR_DEPARTMENT_OTHER): Payer: PPO

## 2019-09-07 ENCOUNTER — Other Ambulatory Visit (HOSPITAL_BASED_OUTPATIENT_CLINIC_OR_DEPARTMENT_OTHER): Payer: Self-pay | Admitting: Family Medicine

## 2019-09-07 DIAGNOSIS — M7989 Other specified soft tissue disorders: Secondary | ICD-10-CM

## 2019-09-07 DIAGNOSIS — R52 Pain, unspecified: Secondary | ICD-10-CM

## 2019-09-08 ENCOUNTER — Ambulatory Visit (HOSPITAL_BASED_OUTPATIENT_CLINIC_OR_DEPARTMENT_OTHER)
Admission: RE | Admit: 2019-09-08 | Discharge: 2019-09-08 | Disposition: A | Discharge status: Home / Self Care | Payer: PPO | Source: Ambulatory Visit | Attending: Family Medicine | Admitting: Family Medicine

## 2019-09-08 ENCOUNTER — Other Ambulatory Visit: Payer: Self-pay

## 2019-09-08 DIAGNOSIS — I82431 Acute embolism and thrombosis of right popliteal vein: Secondary | ICD-10-CM | POA: Diagnosis not present

## 2019-09-08 DIAGNOSIS — R52 Pain, unspecified: Secondary | ICD-10-CM | POA: Insufficient documentation

## 2019-09-08 DIAGNOSIS — M7989 Other specified soft tissue disorders: Secondary | ICD-10-CM | POA: Insufficient documentation

## 2019-09-08 DIAGNOSIS — I10 Essential (primary) hypertension: Secondary | ICD-10-CM | POA: Diagnosis not present

## 2019-09-08 DIAGNOSIS — M4807 Spinal stenosis, lumbosacral region: Secondary | ICD-10-CM | POA: Diagnosis not present

## 2019-09-24 ENCOUNTER — Other Ambulatory Visit: Payer: Self-pay

## 2019-09-24 ENCOUNTER — Emergency Department (HOSPITAL_BASED_OUTPATIENT_CLINIC_OR_DEPARTMENT_OTHER): Payer: PPO

## 2019-09-24 ENCOUNTER — Emergency Department (HOSPITAL_BASED_OUTPATIENT_CLINIC_OR_DEPARTMENT_OTHER)
Admission: EM | Admit: 2019-09-24 | Discharge: 2019-09-24 | Disposition: A | Discharge status: Home / Self Care | Payer: PPO | Attending: Emergency Medicine | Admitting: Emergency Medicine

## 2019-09-24 ENCOUNTER — Encounter (HOSPITAL_BASED_OUTPATIENT_CLINIC_OR_DEPARTMENT_OTHER): Payer: Self-pay | Admitting: *Deleted

## 2019-09-24 DIAGNOSIS — I82442 Acute embolism and thrombosis of left tibial vein: Secondary | ICD-10-CM | POA: Insufficient documentation

## 2019-09-24 DIAGNOSIS — I10 Essential (primary) hypertension: Secondary | ICD-10-CM | POA: Diagnosis not present

## 2019-09-24 DIAGNOSIS — Z882 Allergy status to sulfonamides status: Secondary | ICD-10-CM | POA: Insufficient documentation

## 2019-09-24 DIAGNOSIS — Z91040 Latex allergy status: Secondary | ICD-10-CM | POA: Diagnosis not present

## 2019-09-24 DIAGNOSIS — Z79899 Other long term (current) drug therapy: Secondary | ICD-10-CM | POA: Insufficient documentation

## 2019-09-24 DIAGNOSIS — Z880 Allergy status to penicillin: Secondary | ICD-10-CM | POA: Diagnosis not present

## 2019-09-24 DIAGNOSIS — R600 Localized edema: Secondary | ICD-10-CM | POA: Diagnosis not present

## 2019-09-24 DIAGNOSIS — I82452 Acute embolism and thrombosis of left peroneal vein: Secondary | ICD-10-CM | POA: Insufficient documentation

## 2019-09-24 DIAGNOSIS — I82462 Acute embolism and thrombosis of left calf muscular vein: Secondary | ICD-10-CM

## 2019-09-24 DIAGNOSIS — M79605 Pain in left leg: Secondary | ICD-10-CM

## 2019-09-24 DIAGNOSIS — Z7901 Long term (current) use of anticoagulants: Secondary | ICD-10-CM | POA: Diagnosis not present

## 2019-09-24 DIAGNOSIS — I82402 Acute embolism and thrombosis of unspecified deep veins of left lower extremity: Secondary | ICD-10-CM | POA: Diagnosis not present

## 2019-09-24 DIAGNOSIS — M25572 Pain in left ankle and joints of left foot: Secondary | ICD-10-CM | POA: Diagnosis not present

## 2019-09-24 MED ORDER — RIVAROXABAN (XARELTO) EDUCATION KIT FOR DVT/PE PATIENTS: PACK | Freq: Once | Status: AC

## 2019-09-24 MED ORDER — RIVAROXABAN (XARELTO) VTE STARTER PACK (15 & 20 MG): ORAL_TABLET | ORAL | 0 refills | Status: DC

## 2019-09-24 MED FILL — XARELTO STARTER PACK: 15 & 20 | 28 days supply | Qty: 51 | Fill #0

## 2019-09-24 NOTE — ED Notes (Signed)
Patient verbalizes understanding of discharge instructions. Opportunity for questioning and answers were provided. Armband removed by staff, pt discharged from ED.  

## 2019-09-24 NOTE — Discharge Instructions (Addendum)
Please call Dr. Jodi Mourning office to request a referral to a hematologist.  You are diagnosed with a blood clot in your left calf today.  We decided to switch you from your Eliquis to a new blood thinner medicine called Xarelto.  You should STOP taking your Eliquis today.  You should start taking your Xarelto this evening before bedtime, as instructed.  The medicine will come with an education pack and a starter kit, explaining how to take the medicine.  Please use as prescribed.  If you have any further questions, call Dr. Jodi Mourning office.  If the pain in your left leg is getting worse, or you begin having numbness or discoloration in your left foot, or if you begin having chest pain or shortness of breath or lightheadedness, you should come to the emergency department immediately

## 2019-09-24 NOTE — ED Notes (Addendum)
Pt is waiting in the car with her daughter, Jacqlyn Larsen. Daughter called and given update. Informed we will call pt when tx room is available. [phone]

## 2019-09-24 NOTE — ED Provider Notes (Signed)
[facility] EMERGENC[facility] DEPARTMENT Provider Note   CSN: [id number] Arrival date & time: 09/24/19  1200     History Chief Complaint  Patient presents with  . Ankle Pain    Denise Hawkins is a 79 y.o. female with a history of right leg acute popliteal DVT diagnosed approximately 1 month ago, on Eliquis, presenting to the emergency department with left leg pain.  She reports pain and tenderness around her left ankle beginning yesterday.  She denies any trauma or injuries or twisting her leg.  She denies any noticeable swelling.  She says she spoke to her doctor today who advised she come to the emergency department for a DVT ultrasound.  She denies chest pain, difficulty breathing, numbness or weakness in extremities.  She is able to ambulate.  HPI     Past Medical History:  Diagnosis Date  . Anxiety   . Arthritis   . Gastritis   . GERD (gastroesophageal reflux disease)   . GERD (gastroesophageal reflux disease)   . Headache(784.0)    migraines  . Hypertension   . PONV (postoperative nausea and vomiting)     Patient Active Problem List   Diagnosis Date Noted  . Mass of right submandibular region 11/20/2017  . Submandibular gland mass 11/20/2017  . Expected blood loss anemia 07/08/2013  . Overweight (BMI 25.0-29.9) 07/08/2013  . S/P right THA, AA 07/07/2013    Past Surgical History:  Procedure Laterality Date  . APPENDECTOMY    . BACK SURGERY    . BREAST SURGERY     right biopsy-benign  . COLONOSCOPY    . DILATION AND CURETTAGE OF UTERUS     x4  . ESOPHAGOGASTRODUODENOSCOPY    . EYE SURGERY     left with lens implant  . SUBMANDIBULAR GLAND EXCISION Right 11/20/2017   Procedure: EXCISION  OF RIGHT SUBMANDIBULAR GLAND;  Surgeon: Jerrell Belfast, MD;  Location: [facility];  Service: ENT;  Laterality: Right;  . TONSILLECTOMY    . TOTAL HIP ARTHROPLASTY Right 07/07/2013   Procedure: RIGHT TOTAL HIP ARTHROPLASTY ANTERIOR APPROACH;  Surgeon: Mauri Pole, MD;   Location: [facility];  Service: Orthopedics;  Laterality: Right;     OB History   No obstetric history on file.     Family History  Problem Relation Age of Onset  . Heart disease Father     Social History   Tobacco Use  . Smoking status: Never Smoker  . Smokeless tobacco: Never Used  Substance Use Topics  . Alcohol use: No  . Drug use: No    Home Medications Prior to Admission medications   Medication Sig Start Date End Date Taking? Authorizing Provider  acetaminophen (TYLENOL) 500 MG tablet Take 500 mg by mouth every 6 (six) hours as needed (for pain.).    [provider]  amLODipine (NORVASC) 2.5 MG tablet Take 2.5 mg by mouth daily.    [provider]  atenolol (TENORMIN) 25 MG tablet Take 25 mg by mouth every morning.    [provider]  Chlorpheniramine-DM (CORICIDIN HBP COUGH/COLD PO) Take 1 tablet by mouth every 6 (six) hours as needed (for cold/congestion.).    [provider]  Docusate Sodium (PHILLIPS LIQUI-GELS PO) Take 1 capsule by mouth daily as needed (for constipation.).    [provider]  enalapril (VASOTEC) 20 MG tablet Take 20 mg by mouth 2 (two) times daily.    [provider]  hydrochlorothiazide (MICROZIDE) 12.5 MG capsule Take 12.5 mg by  mouth daily.    [provider]  HYDROcodone-acetaminophen (NORCO/VICODIN) 5-325 MG tablet Take 1-2 tablets by mouth every 4 (four) hours as needed for moderate pain. 11/21/17   Jerrell Belfast, MD  LORazepam (ATIVAN) 0.5 MG tablet Take 0.5 mg by mouth 2 (two) times daily as needed (for anxiety/migraine headaches.).     [provider]  omeprazole (PRILOSEC) 20 MG capsule Take 20 mg by mouth daily before breakfast.     [provider]  Probiotic Product (PROBIOTIC PO) Take 1 capsule by mouth daily.    [provider]  Rivaroxaban 15 & 20 MG TBPK Follow package directions: Take one 48m tablet by mouth twice a day. On day 22, switch to one  237mtablet once a day. Take with food. 09/24/19   TrWyvonnia DuskyMD  sucralfate (CARAFATE) 1 g tablet Take 1 g by mouth 4 (four) times daily -  before meals and at bedtime.    [provider]    Allergies    Latex, Penicillins, and Sulfa antibiotics  Review of Systems   Review of Systems  Constitutional: Negative for chills and fever.  Cardiovascular: Negative for chest pain and leg swelling.  Musculoskeletal: Positive for arthralgias, gait problem and myalgias.  Skin: Negative for color change, rash and wound.  Neurological: Negative for weakness and numbness.  Psychiatric/Behavioral: Negative for agitation and confusion.  All other systems reviewed and are negative.   Physical Exam Updated Vital Signs BP (!) 152/63 (BP Location: Right Arm)   Pulse 60   Temp 98.6 F (37 C) (Oral)   Resp 16   Ht 5' 8" (1.727 m)   Wt 69.9 kg   SpO2 98%   BMI 23.42 kg/m   Physical Exam Vitals and nursing note reviewed.  Constitutional:      General: She is not in acute distress.    Appearance: She is well-developed.  HENT:     Head: Normocephalic and atraumatic.  Eyes:     Conjunctiva/sclera: Conjunctivae normal.  Cardiovascular:     Rate and Rhythm: Normal rate and regular rhythm.     Pulses: Normal pulses.  Pulmonary:     Effort: Pulmonary effort is normal. No respiratory distress.  Abdominal:     Palpations: Abdomen is soft.  Musculoskeletal:     Cervical back: Neck supple.     Comments: TTP of the left posterior lateral malleoli and lower calf, no visible swelling of the calf, no warmth or erythema on exam  Skin:    General: Skin is warm and dry.  Neurological:     Mental Status: She is alert.  Psychiatric:        Mood and Affect: Mood normal.        Behavior: Behavior normal.     ED Results / Procedures / Treatments   Labs (all labs ordered are listed, but only abnormal results are displayed) Labs Reviewed - No data to display  EKG None  Radiology DG  Ankle Complete Left  Result Date: 09/24/2019 CLINICAL DATA:  Acute left ankle pain without known injury. EXAM: LEFT ANKLE COMPLETE - 3+ VIEW COMPARISON:  None. FINDINGS: There is no evidence of fracture, dislocation, or joint effusion. There is no evidence of arthropathy or other focal bone abnormality. Soft tissues are unremarkable. IMPRESSION: Negative. Electronically Signed   By: JaMarijo Conception.D.   On: 09/24/2019 12:37   [place] Img Lower Unilateral Left  Result Date: 09/24/2019 CLINICAL DATA:  Left lower extremity pain  and edema EXAM: LEFT LOWER EXTREMITY VENOUS DOPPLER ULTRASOUND TECHNIQUE: Gray-scale sonography with graded compression, as well as color Doppler and duplex ultrasound were performed to evaluate the lower extremity deep venous systems from the level of the common femoral vein and including the common femoral, femoral, profunda femoral, popliteal and calf veins including the posterior tibial, peroneal and gastrocnemius veins when visible. The superficial great saphenous vein was also interrogated. Spectral Doppler was utilized to evaluate flow at rest and with distal augmentation maneuvers in the common femoral, femoral and popliteal veins. COMPARISON:  09/08/2019 FINDINGS: Contralateral Common Femoral Vein: Respiratory phasicity is normal and symmetric with the symptomatic side. No evidence of thrombus. Normal compressibility. Common Femoral Vein: No evidence of thrombus. Normal compressibility, respiratory phasicity and response to augmentation. Saphenofemoral Junction: No evidence of thrombus. Normal compressibility and flow on color Doppler imaging. Profunda Femoral Vein: No evidence of thrombus. Normal compressibility and flow on color Doppler imaging. Femoral Vein: No evidence of thrombus. Normal compressibility, respiratory phasicity and response to augmentation. Popliteal Vein: No evidence of thrombus. Normal compressibility, respiratory phasicity and response to augmentation.  Calf Veins: No evidence of thrombus. Normal compressibility and flow on color Doppler imaging. Superficial Great Saphenous Vein: the calf tibial and peroneal veins demonstrate nonocclusive intraluminal mixed echogenicity thrombus some of which is hyperechoic. Vessels are partially compressible. Very minor or low thrombus burden. Venous Reflux:  None. Other Findings:  None. IMPRESSION: Nonocclusive age-indeterminate calf region DVT in the tibial and peroneal veins with partial compressibility. Very minor thrombus burden. No propagation into the popliteal and femoral veins. Electronically Signed   By: Jerilynn Mages.  Shick M.D.   On: 09/24/2019 15:05    Procedures Procedures (including critical care time)  Medications Ordered in ED Medications  rivaroxaban Alveda Reasons) Education Kit for DVT/PE patients ( Does not apply Given 09/24/19 1629)    ED Course  I have reviewed the triage vital signs and the nursing notes.  Pertinent labs & imaging results that were available during my care of the patient were reviewed by me and considered in my medical decision making (see chart for details).  79 yo female w/ recent right leg DVT diagnosis, on Eliquis, presenting with left lower calf and left ankle pain for 2 days.  Xrays benign.  DVT ultrasound with nonocclusive calf thrombus, low thrombus burden.  Distal pulses intact.  No signs or symptoms or PE.   After conferring with her PCP, we agreed to switch her from eliquis onto xarelto.  It's not clear how long this clot has been here, but it does appear minimal in size, and overall I think can be managed as an outpatient.  Patient will need hematology f/u and I'll ask Dr. Jacelyn Grip to help arrange this.  Clinical Course as of Sep 23 1856  Thu Sep 24, 2019  1532 IMPRESSION: Nonocclusive age-indeterminate calf region DVT in the tibial and peroneal veins with partial compressibility. Very minor thrombus burden. No propagation into the popliteal and femoral veins.   [MT]  1610  Discussed case with Dr Jacelyn Grip patient's PCP who agrees with transitioning patient to [place] beginning tonight, and having her f/u with hematology for this persistent clotting.  This was discussed with patient and her daughter on the phone, who are in agreement.   [MT]    Clinical Course User Index [MT] Wyvonnia Dusky, MD    Final Clinical Impression(s) / ED Diagnoses Final diagnoses:  Left leg pain  Acute deep vein thrombosis (DVT) of calf muscle vein of left  lower extremity [facility])    Rx / DC Orders ED Discharge Orders         Ordered    Rivaroxaban 15 & 20 MG TBPK     09/24/19 1614           Wyvonnia Dusky, MD 09/24/19 1859

## 2019-09-24 NOTE — ED Triage Notes (Signed)
Pain in her left ankle and lower leg since last night. Hx of blood clots in her right leg. She takes blood thinners.

## 2019-09-25 ENCOUNTER — Telehealth: Payer: Self-pay | Admitting: Hematology

## 2019-09-25 DIAGNOSIS — K5909 Other constipation: Secondary | ICD-10-CM | POA: Diagnosis not present

## 2019-09-25 DIAGNOSIS — I82403 Acute embolism and thrombosis of unspecified deep veins of lower extremity, bilateral: Secondary | ICD-10-CM | POA: Diagnosis not present

## 2019-09-25 DIAGNOSIS — Z86018 Personal history of other benign neoplasm: Secondary | ICD-10-CM | POA: Diagnosis not present

## 2019-09-25 DIAGNOSIS — F4322 Adjustment disorder with anxiety: Secondary | ICD-10-CM | POA: Diagnosis not present

## 2019-09-25 NOTE — Telephone Encounter (Signed)
Received a new hem referral from Dr. Drema Dallas for - Acute embolism and thrombosis of iliac vein, bilateral. I cld and spoke to the pt's daughter to schedule an appt on 2/18 at 1pm. She's been made aware to arrive 15 minutes early.

## 2019-10-02 DIAGNOSIS — K1123 Chronic sialoadenitis: Secondary | ICD-10-CM | POA: Diagnosis not present

## 2019-10-08 ENCOUNTER — Other Ambulatory Visit: Payer: Self-pay

## 2019-10-08 ENCOUNTER — Inpatient Hospital Stay: Payer: PPO | Attending: Hematology | Admitting: Hematology

## 2019-10-08 VITALS — BP 173/65 | HR 72 | Temp 98.0°F | Resp 18 | Ht 68.0 in | Wt 154.1 lb

## 2019-10-08 DIAGNOSIS — Z8249 Family history of ischemic heart disease and other diseases of the circulatory system: Secondary | ICD-10-CM

## 2019-10-08 DIAGNOSIS — F419 Anxiety disorder, unspecified: Secondary | ICD-10-CM

## 2019-10-08 DIAGNOSIS — I10 Essential (primary) hypertension: Secondary | ICD-10-CM

## 2019-10-08 DIAGNOSIS — K219 Gastro-esophageal reflux disease without esophagitis: Secondary | ICD-10-CM | POA: Diagnosis not present

## 2019-10-08 DIAGNOSIS — Z86718 Personal history of other venous thrombosis and embolism: Secondary | ICD-10-CM | POA: Diagnosis not present

## 2019-10-08 DIAGNOSIS — Z7901 Long term (current) use of anticoagulants: Secondary | ICD-10-CM | POA: Insufficient documentation

## 2019-10-08 DIAGNOSIS — Z79899 Other long term (current) drug therapy: Secondary | ICD-10-CM | POA: Diagnosis not present

## 2019-10-08 DIAGNOSIS — I824Z3 Acute embolism and thrombosis of unspecified deep veins of distal lower extremity, bilateral: Secondary | ICD-10-CM

## 2019-10-08 DIAGNOSIS — Z96641 Presence of right artificial hip joint: Secondary | ICD-10-CM | POA: Diagnosis not present

## 2019-10-08 DIAGNOSIS — I82403 Acute embolism and thrombosis of unspecified deep veins of lower extremity, bilateral: Secondary | ICD-10-CM | POA: Insufficient documentation

## 2019-10-08 NOTE — Progress Notes (Signed)
HEMATOLOGY/ONCOLOGY CONSULTATION NOTE  Date of Service: 10/08/2019  Patient Care Team: Vernie Shanks, MD as PCP - General (Family Medicine)  CHIEF COMPLAINTS/PURPOSE OF CONSULTATION:  B/L DVT  HISTORY OF PRESENTING ILLNESS:   Denise Hawkins is a wonderful 79 y.o. female who has been referred to [place] by Dr. Drema Dallas for evaluation and management of bilateral DVT. Pt is accompanied today by her daughter, Reynolds Bowl. The pt reports that she is doing well overall.  The pt reports that she has had no previous blood clots and no family history of blood clots. She notes that her grandmother did have varicose veins. Pt has been pregnant twice and has two children. She has not had a mammogram in the last few years and denies any new breast symptoms.   In November pt began to feel very weak and tired. She was also having stomach pain, gas and nausea for which she had a workup with Dr. Alessandra Bevels. She has continued to have upper abdominal discomfort that is worsened after spicy food. She has had imaging but has not had an Endoscopy recently. She had an Endoscopy many years ago after similar stomach discomfort and was found to have Gastritis. After her 12/22 CT C/A/P noted changes in her lung she went to see a Pulmonologist. The Pulmonologist found lung inflammation but did not give a specific diagnosis. She plans to do repeat scans in a few months. She has lost about 10 lbs in the last 6 months, but pt attributes this to stress and stomach symptoms. Pt had to do a lot of healthcare for her husband over the past two years.   Pt was having swelling in both of her legs, worse in the right for quite a bit before her blood clot was found. She had a right hip replacement in 2014 and her right leg has been swollen since then. When the right calf became painful to the touch it prompted her first Venous US, where she was found to have a blood clot in her right leg. She had no symptoms in her left leg this time. Pt was  then placed on Eliquis. She was taking 5 mg twice daily, as prescribed, and two weeks later she began to feel symptoms in her left leg that were similar to her right leg. During this time pt was walking a lot, although she was still experiencing right leg pain and swelling. After her second US Venous it was discovered that she had a blood clot in her left leg and she was switched to Xarelto. She notes improvement in leg pain and swelling after starting Xarelto. Pt was not on Asprin prior to her DVTs and notes that her right leg is still sore.  Of note prior to the patient's visit today, pt has had US Venous ([id number]) completed on 09/08/2019 with results revealing "Acute deep venous thrombosis is noted in the right popliteal and gastrocnemius veins."   Pt has had US Venous ([id number]) completed on 09/24/2019 with results revealing "Nonocclusive age-indeterminate calf region DVT in the tibial and peroneal veins with partial compressibility. Very minor thrombus burden. No propagation into the popliteal and femoral veins."  Most recent lab results (09/08/2019) of CBC w/diff and CMP is as follows: all values are WNL except for Calcium at 10.4, GFR Est Non Af Am at 53. 09/08/2019 Factor V Leiden is "Negative" 09/08/2019 aPTT at 22, INR at 1.0, Prothromin Time at 10.4,  09/08/2019 Antithrombin Activity at 112, Antithrombin Antigen at 104 09/08/2019  Protein S free at 139, Protein S total at 131, Protein S func at 94, Protein C func at 192, Protein C antigen at 143  On review of systems, pt reports weakness, fatigue, right calf soreness and denies cough, fevers, wheezing, chest pain, SOB, urinary habit changes, bowel habit changes, breast lumps/bumps and any other symptoms.   On PMHx the pt reports Anxiety, Gastritis, EGD, Right Total Hip Arthroplasty, Migraines.  MEDICAL HISTORY:  Past Medical History:  Diagnosis Date  . Anxiety   . Arthritis   . Gastritis   . GERD (gastroesophageal reflux disease)    . GERD (gastroesophageal reflux disease)   . Headache(784.0)    migraines  . Hypertension   . PONV (postoperative nausea and vomiting)     SURGICAL HISTORY: Past Surgical History:  Procedure Laterality Date  . APPENDECTOMY    . BACK SURGERY    . BREAST SURGERY     right biopsy-benign  . COLONOSCOPY    . DILATION AND CURETTAGE OF UTERUS     x4  . ESOPHAGOGASTRODUODENOSCOPY    . EYE SURGERY     left with lens implant  . SUBMANDIBULAR GLAND EXCISION Right 11/20/2017   Procedure: EXCISION  OF RIGHT SUBMANDIBULAR GLAND;  Surgeon: Jerrell Belfast, MD;  Location: [facility];  Service: ENT;  Laterality: Right;  . TONSILLECTOMY    . TOTAL HIP ARTHROPLASTY Right 07/07/2013   Procedure: RIGHT TOTAL HIP ARTHROPLASTY ANTERIOR APPROACH;  Surgeon: Mauri Pole, MD;  Location: [facility];  Service: Orthopedics;  Laterality: Right;    SOCIAL HISTORY: Social History   Socioeconomic History  . Marital status: Married    Spouse name: Not on file  . Number of children: Not on file  . Years of education: Not on file  . Highest education level: Not on file  Occupational History  . Not on file  Tobacco Use  . Smoking status: Never Smoker  . Smokeless tobacco: Never Used  Substance and Sexual Activity  . Alcohol use: No  . Drug use: No  . Sexual activity: Not on file  Other Topics Concern  . Not on file  Social History Narrative  . Not on file   Social Determinants of Health   Financial Resource Strain:   . Difficulty of Paying Living Expenses: Not on file  Food Insecurity:   . Worried About Charity fundraiser in the Last Year: Not on file  . Ran Out of Food in the Last Year: Not on file  Transportation Needs:   . Lack of Transportation (Medical): Not on file  . Lack of Transportation (Non-Medical): Not on file  Physical Activity:   . Days of Exercise per Week: Not on file  . Minutes of Exercise per Session: Not on file  Stress:   . Feeling of Stress : Not on file  Social  Connections:   . Frequency of Communication with Friends and Family: Not on file  . Frequency of Social Gatherings with Friends and Family: Not on file  . Attends Religious Services: Not on file  . Active Member of Clubs or Organizations: Not on file  . Attends Archivist Meetings: Not on file  . Marital Status: Not on file  Intimate Partner Violence:   . Fear of Current or Ex-Partner: Not on file  . Emotionally Abused: Not on file  . Physically Abused: Not on file  . Sexually Abused: Not on file    FAMILY HISTORY: Family History  Problem Relation Age of  Onset  . Heart disease Father     ALLERGIES:  is allergic to latex; penicillins; and sulfa antibiotics.  MEDICATIONS:  Current Outpatient Medications  Medication Sig Dispense Refill  . acetaminophen (TYLENOL) 500 MG tablet Take 500 mg by mouth every 6 (six) hours as needed (for pain.).    Marland Kitchen amLODipine (NORVASC) 2.5 MG tablet Take 2.5 mg by mouth daily.    Marland Kitchen atenolol (TENORMIN) 25 MG tablet Take 25 mg by mouth every morning.    . Chlorpheniramine-DM (CORICIDIN HBP COUGH/COLD PO) Take 1 tablet by mouth every 6 (six) hours as needed (for cold/congestion.).    Marland Kitchen Docusate Sodium (PHILLIPS LIQUI-GELS PO) Take 1 capsule by mouth daily as needed (for constipation.).    Marland Kitchen enalapril (VASOTEC) 20 MG tablet Take 20 mg by mouth 2 (two) times daily.    . hydrochlorothiazide (MICROZIDE) 12.5 MG capsule Take 12.5 mg by mouth daily.    Marland Kitchen LORazepam (ATIVAN) 0.5 MG tablet Take 0.5 mg by mouth 2 (two) times daily as needed (for anxiety/migraine headaches.).     Marland Kitchen omeprazole (PRILOSEC) 20 MG capsule Take 20 mg by mouth daily before breakfast.     . Probiotic Product (PROBIOTIC PO) Take 1 capsule by mouth daily.    . Rivaroxaban 15 & 20 MG TBPK Follow package directions: Take one 15mg  tablet by mouth twice a day. On day 22, switch to one 20mg  tablet once a day. Take with food. 51 each 0  . sucralfate (CARAFATE) 1 g tablet Take 1 g by mouth 4  (four) times daily -  before meals and at bedtime.    Marland Kitchen HYDROcodone-acetaminophen (NORCO/VICODIN) 5-325 MG tablet Take 1-2 tablets by mouth every 4 (four) hours as needed for moderate pain. 30 tablet 0   No current facility-administered medications for this visit.    REVIEW OF SYSTEMS:    10 Point review of Systems was done is negative except as noted above.  PHYSICAL EXAMINATION: ECOG PERFORMANCE STATUS: 2 - Symptomatic, <50% confined to bed  . Vitals:   10/08/19 1307  BP: (!) 173/65  Pulse: 72  Resp: 18  Temp: 98 F (36.7 C)  SpO2: 98%   Filed Weights   10/08/19 1307  Weight: 154 lb 1.6 oz (69.9 kg)   .Body mass index is 23.43 kg/m.  Exam was given in a chair   GENERAL:alert, in no acute distress and comfortable SKIN: no acute rashes, no significant lesions EYES: conjunctiva are pink and non-injected, sclera anicteric OROPHARYNX: MMM, no exudates, no oropharyngeal erythema or ulceration NECK: supple, no JVD LYMPH:  no palpable lymphadenopathy in the cervical, axillary or inguinal regions LUNGS: clear to auscultation b/l with normal respiratory effort HEART: regular rate & rhythm ABDOMEN:  normoactive bowel sounds , non tender, not distended. Extremity: no pedal edema, right calf tenderness to palpation - some discomfort along GSV suggesting superficial thrombophlebitis PSYCH: alert & oriented x 3 with fluent speech NEURO: no focal motor/sensory deficits  LABORATORY DATA:  I have reviewed the data as listed  . CBC Latest Ref Rng & Units 11/20/2017 11/15/2017 07/09/2013  WBC 4.0 - 10.5 K/uL 11.9(H) 8.7 15.3(H)  Hemoglobin 12.0 - 15.0 g/dL 11.7(L) 12.0 9.9(L)  Hematocrit 36.0 - 46.0 % 35.6(L) 37.8 30.1(L)  Platelets 150 - 400 K/uL 214 231 175    . CMP Latest Ref Rng & Units 11/20/2017 11/15/2017 07/09/2013  Glucose 65 - 99 mg/dL - 98 167(H)  BUN 6 - 20 mg/dL - 20 12  Creatinine 0.44 - 1.00  mg/dL 0.97 1.11(H) 0.72  Sodium 135 - 145 mmol/L - 139 137  Potassium 3.5  - 5.1 mmol/L - 4.7 4.5  Chloride 101 - 111 mmol/L - 104 104  CO2 22 - 32 mmol/L - 28 26  Calcium 8.9 - 10.3 mg/dL - 9.8 9.5     RADIOGRAPHIC STUDIES: I have personally reviewed the radiological images as listed and agreed with the findings in the report. DG Ankle Complete Left  Result Date: 09/24/2019 CLINICAL DATA:  Acute left ankle pain without known injury. EXAM: LEFT ANKLE COMPLETE - 3+ VIEW COMPARISON:  None. FINDINGS: There is no evidence of fracture, dislocation, or joint effusion. There is no evidence of arthropathy or other focal bone abnormality. Soft tissues are unremarkable. IMPRESSION: Negative. Electronically Signed   By: Marijo Conception M.D.   On: 09/24/2019 12:37   US Venous Img Lower Unilateral Left  Result Date: 09/24/2019 CLINICAL DATA:  Left lower extremity pain and edema EXAM: LEFT LOWER EXTREMITY VENOUS DOPPLER ULTRASOUND TECHNIQUE: Gray-scale sonography with graded compression, as well as color Doppler and duplex ultrasound were performed to evaluate the lower extremity deep venous systems from the level of the common femoral vein and including the common femoral, femoral, profunda femoral, popliteal and calf veins including the posterior tibial, peroneal and gastrocnemius veins when visible. The superficial great saphenous vein was also interrogated. Spectral Doppler was utilized to evaluate flow at rest and with distal augmentation maneuvers in the common femoral, femoral and popliteal veins. COMPARISON:  09/08/2019 FINDINGS: Contralateral Common Femoral Vein: Respiratory phasicity is normal and symmetric with the symptomatic side. No evidence of thrombus. Normal compressibility. Common Femoral Vein: No evidence of thrombus. Normal compressibility, respiratory phasicity and response to augmentation. Saphenofemoral Junction: No evidence of thrombus. Normal compressibility and flow on color Doppler imaging. Profunda Femoral Vein: No evidence of thrombus. Normal compressibility and  flow on color Doppler imaging. Femoral Vein: No evidence of thrombus. Normal compressibility, respiratory phasicity and response to augmentation. Popliteal Vein: No evidence of thrombus. Normal compressibility, respiratory phasicity and response to augmentation. Calf Veins: No evidence of thrombus. Normal compressibility and flow on color Doppler imaging. Superficial Great Saphenous Vein: the calf tibial and peroneal veins demonstrate nonocclusive intraluminal mixed echogenicity thrombus some of which is hyperechoic. Vessels are partially compressible. Very minor or low thrombus burden. Venous Reflux:  None. Other Findings:  None. IMPRESSION: Nonocclusive age-indeterminate calf region DVT in the tibial and peroneal veins with partial compressibility. Very minor thrombus burden. No propagation into the popliteal and femoral veins. Electronically Signed   By: Jerilynn Mages.  Shick M.D.   On: 09/24/2019 15:05    ASSESSMENT & PLAN:   79 yo with   1) Unprovoked b/l lower extremity DVT PLAN: -Discussed patient's most recent labs from 09/08/2019, all values are WNL except for Calcium at 10.4, GFR Est Non Af Am at 53. -Discussed 09/08/2019 Factor V Leiden is "Negative" -Discussed 09/08/2019 aPTT at 22, INR at 1.0, Prothromin Time at 10.4,  -Discussed 09/08/2019 Antithrombin Activity at 112, Antithrombin Antigen at 104 -Discussed 09/08/2019 Protein S free at 139, Protein S total at 131, Protein S func at 94, Protein C func at 192, Protein C antigen at 143 -Discussed 09/08/2019 US Venous ([id number]) which revealed "Acute deep venous thrombosis is noted in the right popliteal and gastrocnemius veins."  -Discussed 09/08/2019 US Venous ([id number]) which revealed "Nonocclusive age-indeterminate calf region DVT in the tibial and peroneal veins with partial compressibility. Very minor thrombus burden. No propagation into the popliteal and femoral  veins." -Advised pt that age and infection can increase the risk of blood clots    -there is a  8-15% chance of a repeat blood clot within a year after a clot with no provoking factor.   -Advised pt that missing doses, stomach or bowel issues, extremes of bodyweight, or abnormal liver enzymes could lead to Eliquis failure -Advised pt that it would not be unreasonable to transition back to Eliquis, as there was no clear failure of this medication and the 2nd dvt on left was age indeterminate. -Would recommend pt complete 6 months of anticoagulation before we re-evaluate  -Advised to begin Eliquis two hours prior to that time that she is currently taking Xarelto -Recommend pt wear compression socks, elevate legs, stay well hydrated and avoid folding legs to minimize risk factors  -Recommend pt pay attention to her leg symptomatology when deciding what activities to complete  -Could do additional genetic testing, but a genetic disorder is unlikely given pt's Hx  -Recommend pt f/u with Dr. Alessandra Bevels to complete GI w/u - would wait 6 weeks after uninterrupted anticoagulation prior to an Endoscopy -Recommend pt stay up to date with age-appropriate cancer screenings with PCP -Will do rpt B/L LE Venous US in 11 weeks with labs -Recomment pt use OTC Voltaren cream   -Will see back in 3 months   FOLLOW UP: US venous lower extremities bilateral in 11 weeks Labs in 11 weeks RTC with Dr Irene Limbo in 12 weeks   All of the patients questions were answered with apparent satisfaction. The patient knows to call the clinic with any problems, questions or concerns.  I spent 35 mins counseling the patient face to face. The total time spent in the appointment was 71 mintues and more than 50% was on counseling and direct patient cares.    Sullivan Lone MD [place] AAHIVMS [facility] Hematology/Oncology Physician [facility]  (Office):       [phone] (Work cell):  [phone] (Fax):           [phone]  10/08/2019 2:28 PM  I, Yevette Edwards, am acting as a scribe for Dr.  Sullivan Lone.   .I have reviewed the above documentation for accuracy and completeness, and I agree with the above. Brunetta Genera MD

## 2019-10-08 NOTE — Patient Instructions (Signed)
Thank you for choosing [facility] to provide your oncology and hematology care.   Should you have questions after your visit to the [facility] ([facility]), please contact this office at [phone] between 8:30 AM and 4:30 PM.  Voice mails left after 4:00 PM may not be returned until the following business day.  Calls received after 4:30 PM will be answered by an off-site Nurse Triage Line.    Prescription Refills:  Please have your pharmacy contact us directly for most prescription requests.  Contact the office directly for refills of narcotics (pain medications). Allow 48-72 hours for refills.  Appointments: Please contact the [facility] scheduling department [phone] for questions regarding [facility] appointment scheduling.  Contact the schedulers with any scheduling changes so that your appointment can be rescheduled in a timely manner.   Central Scheduling for [facility] [phone] - Call to schedule procedures such as PET scans, CT scans, MRI, Ultrasound, etc.  To afford each patient quality time with our providers, please arrive 30 minutes before your scheduled appointment time.  If you arrive late for your appointment, you may be asked to reschedule.  We strive to give you quality time with our providers, and arriving late affects you and other patients whose appointments are after yours. If you are a no show for multiple scheduled visits, you may be dismissed from the clinic at the providers discretion.     Resources: [facility] Social Workers [phone] for additional information on assistance programs or assistance connecting with community support programs   [place] DSS  [phone]: Information regarding food stamps, Medicaid, and utility assistance [place] [phone]   [employer]'s shared-ride transportation service for eligible riders who have a disability that prevents them from riding the fixed route bus.   Medicare Rights Center  800-333-4114 Helps people with Medicare understand their rights and benefits, navigate the Medicare system, and secure the quality healthcare they deserve [employer] 800-227-2345 Assists patients locate various types of support and financial assistance Cancer Care: 1-800-813-HOPE (4673) Provides financial assistance, online support groups, medication/co-pay assistance.   Transportation Assistance for appointments at [facility]: Transportation Coordinator [phone]  Again, thank you for choosing [facility] for your care.       

## 2019-10-12 ENCOUNTER — Telehealth: Payer: Self-pay | Admitting: Hematology

## 2019-10-12 NOTE — Telephone Encounter (Signed)
Scheduled per 02/18 los, spoke with patient's daughter and she will be make sure patient is aware of her appointments.

## 2019-10-14 ENCOUNTER — Telehealth: Payer: Self-pay | Admitting: Hematology

## 2019-10-14 NOTE — Telephone Encounter (Signed)
Rescheduled per 2/23 sch msg, pt req. Called and left a msg. Mailing appt

## 2019-10-26 DIAGNOSIS — I82431 Acute embolism and thrombosis of right popliteal vein: Secondary | ICD-10-CM | POA: Diagnosis not present

## 2019-10-26 DIAGNOSIS — F4321 Adjustment disorder with depressed mood: Secondary | ICD-10-CM | POA: Diagnosis not present

## 2019-10-26 DIAGNOSIS — G43909 Migraine, unspecified, not intractable, without status migrainosus: Secondary | ICD-10-CM | POA: Diagnosis not present

## 2019-10-26 DIAGNOSIS — R1084 Generalized abdominal pain: Secondary | ICD-10-CM | POA: Diagnosis not present

## 2019-10-26 DIAGNOSIS — I82403 Acute embolism and thrombosis of unspecified deep veins of lower extremity, bilateral: Secondary | ICD-10-CM | POA: Diagnosis not present

## 2019-10-27 ENCOUNTER — Inpatient Hospital Stay: Admission: RE | Admit: 2019-10-27 | Payer: PPO | Source: Ambulatory Visit

## 2019-10-29 ENCOUNTER — Ambulatory Visit (INDEPENDENT_AMBULATORY_CARE_PROVIDER_SITE_OTHER)
Admission: RE | Admit: 2019-10-29 | Discharge: 2019-10-29 | Disposition: A | Discharge status: Home / Self Care | Payer: PPO | Source: Ambulatory Visit | Attending: Critical Care Medicine | Admitting: Critical Care Medicine

## 2019-10-29 ENCOUNTER — Other Ambulatory Visit: Payer: Self-pay

## 2019-10-29 DIAGNOSIS — R918 Other nonspecific abnormal finding of lung field: Secondary | ICD-10-CM

## 2019-11-03 ENCOUNTER — Ambulatory Visit: Payer: PPO | Admitting: Critical Care Medicine

## 2019-11-04 ENCOUNTER — Ambulatory Visit: Payer: PPO | Admitting: Critical Care Medicine

## 2019-11-05 ENCOUNTER — Encounter: Payer: Self-pay | Admitting: Critical Care Medicine

## 2019-11-05 ENCOUNTER — Ambulatory Visit: Payer: PPO | Admitting: Critical Care Medicine

## 2019-11-05 ENCOUNTER — Other Ambulatory Visit: Payer: Self-pay

## 2019-11-05 VITALS — BP 120/86 | HR 65 | Temp 97.9°F | Ht 68.0 in | Wt 150.6 lb

## 2019-11-05 DIAGNOSIS — R918 Other nonspecific abnormal finding of lung field: Secondary | ICD-10-CM | POA: Diagnosis not present

## 2019-11-05 NOTE — Patient Instructions (Addendum)
Thank you for visiting Dr. Carlis Abbott at [facility]. We recommend the following: Orders Placed This Encounter  Procedures  . CT Chest Wo Contrast   Orders Placed This Encounter  Procedures  . CT Chest Wo Contrast    March 2022    Standing Status:   Future    Standing Expiration Date:   01/04/2021    Order Specific Question:   ** REASON FOR EXAM (FREE TEXT)    Answer:   1 year follow up    Order Specific Question:   Preferred imaging location?    Answer:   [place]    Order Specific Question:   Radiology Contrast Protocol - do NOT remove file path    Answer:   [email or web address]      Return in about 1 year (around 11/04/2020).    Please do your part to reduce the spread of COVID-19.

## 2019-11-05 NOTE — Progress Notes (Signed)
Synopsis: Referred in January 2021 for abnormal imaging by Vernie Shanks, MD.  Subjective:   PATIENT ID: Denise Hawkins GENDER: female DOB: July 22, 1941, MRN: [id number]  Chief Complaint  Patient presents with  . Follow-up    Patient here to follow up after CT on 3/11. Patient is feeling good overall    Denise Hawkins is a 79 year old woman who presents for follow-up of pulmonary nodules.  Since her last visit she denies cough, sputum production, shortness of breath, significant fatigue, fever, chills, sweats.  She has had significant GI issues with gastritis, burning, belching, bloating, mild abdominal pain, symptoms worsened by p.o. intake leading to weight loss.  She has been following with GI and has an EGD planned.  She is on Carafate, PPI, Pepcid with some improvement of her symptoms with escalation of meds.  Since her last visit she was diagnosed with bilateral unprovoked DVTs and is on Eliquis.  She is followed up with hematology.  No history of malignancy, no recent surgeries, prolonged travel or immobilization.   OV 08/24/19: Denise Hawkins is a 79 y/o woman referred for evaluation of an abnormal CT scan. She had an abdominal CT scan performed to evaluate several weeks of abdominal pain and bloating. The lung bases raised concern for possible chronic infectious process. She denies SOB, cough, sputum production, fevers, chills, sweats. She has had more fatigue that normal for about the past 6 months. She has lost about 10# over the past few months that she attributes to poor PO intake from her stomach issues. She has follow up with her gastroenterologist again soon. She has a history of gastritis associated with nausea and stomach burning. Her current symptoms including abdominal and chest pain associated with gas, frequent belching, nausea, and stomach burning. She denies heartburn or the sensation of reflux. She has been taking her PPI.  She has no personal or family history of lung disease or  immune deficits. No previous pneumonias. She is a never smoker, but had significant 2nd hand smoke exposure from her husband. She is UTD on her seasonal flu shot.   Past Medical History:  Diagnosis Date  . Anxiety   . Arthritis   . Gastritis   . GERD (gastroesophageal reflux disease)   . GERD (gastroesophageal reflux disease)   . Headache(784.0)    migraines  . Hypertension   . PONV (postoperative nausea and vomiting)      Family History  Problem Relation Age of Onset  . Heart disease Father      Past Surgical History:  Procedure Laterality Date  . APPENDECTOMY    . BACK SURGERY    . BREAST SURGERY     right biopsy-benign  . COLONOSCOPY    . DILATION AND CURETTAGE OF UTERUS     x4  . ESOPHAGOGASTRODUODENOSCOPY    . EYE SURGERY     left with lens implant  . SUBMANDIBULAR GLAND EXCISION Right 11/20/2017   Procedure: EXCISION  OF RIGHT SUBMANDIBULAR GLAND;  Surgeon: Jerrell Belfast, MD;  Location: [facility];  Service: ENT;  Laterality: Right;  . TONSILLECTOMY    . TOTAL HIP ARTHROPLASTY Right 07/07/2013   Procedure: RIGHT TOTAL HIP ARTHROPLASTY ANTERIOR APPROACH;  Surgeon: Mauri Pole, MD;  Location: [facility];  Service: Orthopedics;  Laterality: Right;    Social History   Socioeconomic History  . Marital status: Married    Spouse name: Not on file  . Number of children: Not on file  . Years of education: Not  on file  . Highest education level: Not on file  Occupational History  . Not on file  Tobacco Use  . Smoking status: Never Smoker  . Smokeless tobacco: Never Used  Substance and Sexual Activity  . Alcohol use: No  . Drug use: No  . Sexual activity: Not on file  Other Topics Concern  . Not on file  Social History Narrative  . Not on file   Social Determinants of Health   Financial Resource Strain:   . Difficulty of Paying Living Expenses:   Food Insecurity:   . Worried About Charity fundraiser in the Last Year:   . Arboriculturist in the Last Year:     Transportation Needs:   . Film/video editor (Medical):   Marland Kitchen Lack of Transportation (Non-Medical):   Physical Activity:   . Days of Exercise per Week:   . Minutes of Exercise per Session:   Stress:   . Feeling of Stress :   Social Connections:   . Frequency of Communication with Friends and Family:   . Frequency of Social Gatherings with Friends and Family:   . Attends Religious Services:   . Active Member of Clubs or Organizations:   . Attends Archivist Meetings:   Marland Kitchen Marital Status:   Intimate Partner Violence:   . Fear of Current or Ex-Partner:   . Emotionally Abused:   Marland Kitchen Physically Abused:   . Sexually Abused:      Allergies  Allergen Reactions  . Latex Rash    Blisters  . Penicillins Rash    Has patient had a PCN reaction causing immediate rash, facial/tongue/throat swelling, SOB or lightheadedness with hypotension: Yes Has patient had a PCN reaction causing severe rash involving mucus membranes or skin necrosis: Unknown Has patient had a PCN reaction that required hospitalization: No Has patient had a PCN reaction occurring within the last 10 years:No If all of the above answers are "NO", then may proceed with Cephalosporin use.   . Sulfa Antibiotics Rash     Immunization History  Administered Date(s) Administered  . Fluad Quad(high Dose 65+) 07/08/2019    Outpatient Medications Prior to Visit  Medication Sig Dispense Refill  . acetaminophen (TYLENOL) 500 MG tablet Take 500 mg by mouth every 6 (six) hours as needed (for pain.).    Marland Kitchen amLODipine (NORVASC) 2.5 MG tablet Take 2.5 mg by mouth daily.    Marland Kitchen apixaban (ELIQUIS) 5 MG TABS tablet Take 1 tablet by mouth daily.    Marland Kitchen atenolol (TENORMIN) 25 MG tablet Take 25 mg by mouth every morning.    . Chlorpheniramine-DM (CORICIDIN HBP COUGH/COLD PO) Take 1 tablet by mouth every 6 (six) hours as needed (for cold/congestion.).    Marland Kitchen Docusate Sodium (PHILLIPS LIQUI-GELS PO) Take 1 capsule by mouth daily as  needed (for constipation.).    Marland Kitchen enalapril (VASOTEC) 20 MG tablet Take 20 mg by mouth 2 (two) times daily.    Marland Kitchen escitalopram (LEXAPRO) 10 MG tablet Take 10 mg by mouth daily.    . hydrochlorothiazide (MICROZIDE) 12.5 MG capsule Take 12.5 mg by mouth daily.    Marland Kitchen HYDROcodone-acetaminophen (NORCO/VICODIN) 5-325 MG tablet Take 1-2 tablets by mouth every 4 (four) hours as needed for moderate pain. 30 tablet 0  . LORazepam (ATIVAN) 0.5 MG tablet Take 0.5 mg by mouth 2 (two) times daily as needed (for anxiety/migraine headaches.).     Marland Kitchen omeprazole (PRILOSEC) 20 MG capsule Take 20 mg by mouth daily  before breakfast.     . Probiotic Product (PROBIOTIC PO) Take 1 capsule by mouth daily.    . sucralfate (CARAFATE) 1 g tablet Take 1 g by mouth 4 (four) times daily -  before meals and at bedtime.    . Rivaroxaban 15 & 20 MG TBPK Follow package directions: Take one 15mg  tablet by mouth twice a day. On day 22, switch to one 20mg  tablet once a day. Take with food. 51 each 0   No facility-administered medications prior to visit.    Review of Systems  Constitutional: Positive for malaise/fatigue and weight loss. Negative for chills and fever.  HENT: Negative for congestion.   Respiratory: Negative for cough, sputum production, shortness of breath and wheezing.   Cardiovascular: Positive for chest pain.  Gastrointestinal: Positive for abdominal pain and nausea. Negative for heartburn.     Objective:   Vitals:   11/05/19 1533  BP: 120/86  Pulse: 65  Temp: 97.9 F (36.6 C)  TempSrc: Temporal  SpO2: 96%  Weight: 150 lb 9.6 oz (68.3 kg)  Height: 5\' 8"  (1.727 m)   96% on   RA BMI Readings from Last 3 Encounters:  11/05/19 22.90 kg/m  10/08/19 23.43 kg/m  09/24/19 23.42 kg/m   Wt Readings from Last 3 Encounters:  11/05/19 150 lb 9.6 oz (68.3 kg)  10/08/19 154 lb 1.6 oz (69.9 kg)  09/24/19 154 lb (69.9 kg)    Physical Exam Vitals reviewed.  Constitutional:      Appearance: Normal  appearance. She is not ill-appearing.  HENT:     Head: Normocephalic and atraumatic.  Eyes:     General: No scleral icterus. Cardiovascular:     Rate and Rhythm: Normal rate and regular rhythm.  Pulmonary:     Comments: Breathing comfortably on room air, no conversational dyspnea.  Clear to auscultation bilaterally. Abdominal:     Palpations: Abdomen is soft.     Comments: Mild tenderness to palpation.  Musculoskeletal:        General: No deformity.     Cervical back: Neck supple.  Lymphadenopathy:     Cervical: No cervical adenopathy.  Skin:    General: Skin is warm and dry.     Findings: No rash.  Neurological:     General: No focal deficit present.     Mental Status: She is alert.     Coordination: Coordination normal.  Psychiatric:        Mood and Affect: Mood normal.        Behavior: Behavior normal.      CBC    Component Value Date/Time   WBC 11.9 (H) 11/20/2017 1534   RBC 3.77 (L) 11/20/2017 1534   HGB 11.7 (L) 11/20/2017 1534   HCT 35.6 (L) 11/20/2017 1534   PLT 214 11/20/2017 1534   MCV 94.4 11/20/2017 1534   MCH 31.0 11/20/2017 1534   MCHC 32.9 11/20/2017 1534   RDW 13.1 11/20/2017 1534    CHEMISTRY No results for input(s): NA, K, CL, CO2, GLUCOSE, BUN, CREATININE, CALCIUM, MG, PHOS in the last 168 hours. CrCl cannot be calculated (Patient's most recent lab result is older than the maximum 21 days allowed.).   Chest Imaging- films reviewed: CT abdomen pelvis 08/11/2019-few scattered areas of groundglass, mild intralobular septal thickening throughout both lower lobes.  Airway thickening without bronchiectasis.  Posterior RML opacity, likely mucous plugging  CT neck 10/31/2017- emphysema, airway thickening, no bronchiectasis  CT chest 10/29/19-minimal unchanged RML and lingular nodules.  No other  areas of cavitation or nodularity.   No significant adenopathy.  Pulmonary Functions Testing Results: No flowsheet data found.      Assessment & Plan:      ICD-10-CM   1. Abnormal CT scan, lung  R91.8 CT Chest Wo Contrast    Abnormal chest CT- GG nodules vs. less likely chronic infection. Most likely these are inflammatory nodules, and may be related to her abdominal pain if she is having gastritis or frequent reflux. -With lack of symptoms, likely serial CT scans are the best way to monitor for potential progression that would warrant bronchoscopy with BAL for microbiologic sampling. If she begins to have SOB or produce sputum, it would be indicated to do an infectious workup, but her nodules are very subtle and may be explained by causes other than chronic infections. -Chest CT in 12 months.  Unprovoked DVT -Agree with follow-up with hematology -Recommend 6 months of Eliquis, possibly indefinitely -Discussed risk versus benefits of stopping anticoagulation earlier in her course versus later for her EGD.      RTC in 12 months after CT scan.   Current Outpatient Medications:  .  acetaminophen (TYLENOL) 500 MG tablet, Take 500 mg by mouth every 6 (six) hours as needed (for pain.)., Disp: , Rfl:  .  amLODipine (NORVASC) 2.5 MG tablet, Take 2.5 mg by mouth daily., Disp: , Rfl:  .  apixaban (ELIQUIS) 5 MG TABS tablet, Take 1 tablet by mouth daily., Disp: , Rfl:  .  atenolol (TENORMIN) 25 MG tablet, Take 25 mg by mouth every morning., Disp: , Rfl:  .  Chlorpheniramine-DM (CORICIDIN HBP COUGH/COLD PO), Take 1 tablet by mouth every 6 (six) hours as needed (for cold/congestion.)., Disp: , Rfl:  .  Docusate Sodium (PHILLIPS LIQUI-GELS PO), Take 1 capsule by mouth daily as needed (for constipation.)., Disp: , Rfl:  .  enalapril (VASOTEC) 20 MG tablet, Take 20 mg by mouth 2 (two) times daily., Disp: , Rfl:  .  escitalopram (LEXAPRO) 10 MG tablet, Take 10 mg by mouth daily., Disp: , Rfl:  .  hydrochlorothiazide (MICROZIDE) 12.5 MG capsule, Take 12.5 mg by mouth daily., Disp: , Rfl:  .  HYDROcodone-acetaminophen (NORCO/VICODIN) 5-325 MG tablet,  Take 1-2 tablets by mouth every 4 (four) hours as needed for moderate pain., Disp: 30 tablet, Rfl: 0 .  LORazepam (ATIVAN) 0.5 MG tablet, Take 0.5 mg by mouth 2 (two) times daily as needed (for anxiety/migraine headaches.). , Disp: , Rfl:  .  omeprazole (PRILOSEC) 20 MG capsule, Take 20 mg by mouth daily before breakfast. , Disp: , Rfl:  .  Probiotic Product (PROBIOTIC PO), Take 1 capsule by mouth daily., Disp: , Rfl:  .  sucralfate (CARAFATE) 1 g tablet, Take 1 g by mouth 4 (four) times daily -  before meals and at bedtime., Disp: , Rfl:     Julian Hy, DO [facility] 11/05/2019 6:41 PM

## 2019-11-10 DIAGNOSIS — Z8719 Personal history of other diseases of the digestive system: Secondary | ICD-10-CM | POA: Diagnosis not present

## 2019-11-10 DIAGNOSIS — R110 Nausea: Secondary | ICD-10-CM | POA: Diagnosis not present

## 2019-11-10 DIAGNOSIS — R1013 Epigastric pain: Secondary | ICD-10-CM | POA: Diagnosis not present

## 2019-11-10 DIAGNOSIS — Z86718 Personal history of other venous thrombosis and embolism: Secondary | ICD-10-CM | POA: Diagnosis not present

## 2019-11-11 ENCOUNTER — Other Ambulatory Visit (HOSPITAL_COMMUNITY): Payer: Self-pay | Admitting: Gastroenterology

## 2019-11-11 ENCOUNTER — Other Ambulatory Visit: Payer: Self-pay | Admitting: Gastroenterology

## 2019-11-11 DIAGNOSIS — R1013 Epigastric pain: Secondary | ICD-10-CM

## 2019-11-11 DIAGNOSIS — R110 Nausea: Secondary | ICD-10-CM

## 2019-11-16 DIAGNOSIS — I824Z3 Acute embolism and thrombosis of unspecified deep veins of distal lower extremity, bilateral: Secondary | ICD-10-CM | POA: Diagnosis not present

## 2019-11-16 DIAGNOSIS — I10 Essential (primary) hypertension: Secondary | ICD-10-CM | POA: Diagnosis not present

## 2019-11-16 DIAGNOSIS — F4321 Adjustment disorder with depressed mood: Secondary | ICD-10-CM | POA: Diagnosis not present

## 2019-11-17 DIAGNOSIS — F4321 Adjustment disorder with depressed mood: Secondary | ICD-10-CM | POA: Diagnosis not present

## 2019-11-17 DIAGNOSIS — I10 Essential (primary) hypertension: Secondary | ICD-10-CM | POA: Diagnosis not present

## 2019-11-17 DIAGNOSIS — E7800 Pure hypercholesterolemia, unspecified: Secondary | ICD-10-CM | POA: Diagnosis not present

## 2019-11-19 ENCOUNTER — Ambulatory Visit (HOSPITAL_COMMUNITY)
Admission: RE | Admit: 2019-11-19 | Discharge: 2019-11-19 | Disposition: A | Discharge status: Home / Self Care | Payer: PPO | Source: Ambulatory Visit | Attending: Gastroenterology | Admitting: Gastroenterology

## 2019-11-19 ENCOUNTER — Encounter (HOSPITAL_COMMUNITY)
Admission: RE | Admit: 2019-11-19 | Discharge: 2019-11-19 | Disposition: A | Discharge status: Home / Self Care | Payer: PPO | Source: Ambulatory Visit | Attending: Gastroenterology | Admitting: Gastroenterology

## 2019-11-19 ENCOUNTER — Other Ambulatory Visit: Payer: Self-pay

## 2019-11-19 DIAGNOSIS — R110 Nausea: Secondary | ICD-10-CM | POA: Insufficient documentation

## 2019-11-19 DIAGNOSIS — R1013 Epigastric pain: Secondary | ICD-10-CM | POA: Insufficient documentation

## 2019-11-19 DIAGNOSIS — R109 Unspecified abdominal pain: Secondary | ICD-10-CM | POA: Diagnosis not present

## 2019-11-19 MED ORDER — TECHNETIUM TC 99M MEBROFENIN IV KIT
5.0000 | PACK | Freq: Once | INTRAVENOUS | Status: AC | PRN
  Administered 2019-11-19: 07:00:00 5 via INTRAVENOUS

## 2019-12-09 DIAGNOSIS — F4321 Adjustment disorder with depressed mood: Secondary | ICD-10-CM | POA: Diagnosis not present

## 2019-12-09 DIAGNOSIS — E7800 Pure hypercholesterolemia, unspecified: Secondary | ICD-10-CM | POA: Diagnosis not present

## 2019-12-09 DIAGNOSIS — I10 Essential (primary) hypertension: Secondary | ICD-10-CM | POA: Diagnosis not present

## 2019-12-10 DIAGNOSIS — R110 Nausea: Secondary | ICD-10-CM | POA: Diagnosis not present

## 2019-12-10 DIAGNOSIS — R1013 Epigastric pain: Secondary | ICD-10-CM | POA: Diagnosis not present

## 2019-12-10 DIAGNOSIS — Z86718 Personal history of other venous thrombosis and embolism: Secondary | ICD-10-CM | POA: Diagnosis not present

## 2019-12-10 DIAGNOSIS — Z8719 Personal history of other diseases of the digestive system: Secondary | ICD-10-CM | POA: Diagnosis not present

## 2019-12-23 ENCOUNTER — Other Ambulatory Visit: Payer: Self-pay

## 2019-12-23 ENCOUNTER — Inpatient Hospital Stay: Payer: PPO | Attending: Hematology

## 2019-12-23 DIAGNOSIS — Z86711 Personal history of pulmonary embolism: Secondary | ICD-10-CM | POA: Diagnosis not present

## 2019-12-23 DIAGNOSIS — Z79899 Other long term (current) drug therapy: Secondary | ICD-10-CM | POA: Diagnosis not present

## 2019-12-23 DIAGNOSIS — Z8249 Family history of ischemic heart disease and other diseases of the circulatory system: Secondary | ICD-10-CM | POA: Diagnosis not present

## 2019-12-23 DIAGNOSIS — Z86718 Personal history of other venous thrombosis and embolism: Secondary | ICD-10-CM | POA: Diagnosis not present

## 2019-12-23 DIAGNOSIS — I10 Essential (primary) hypertension: Secondary | ICD-10-CM | POA: Insufficient documentation

## 2019-12-23 DIAGNOSIS — Z7901 Long term (current) use of anticoagulants: Secondary | ICD-10-CM | POA: Diagnosis not present

## 2019-12-23 DIAGNOSIS — F419 Anxiety disorder, unspecified: Secondary | ICD-10-CM | POA: Insufficient documentation

## 2019-12-23 DIAGNOSIS — I824Z3 Acute embolism and thrombosis of unspecified deep veins of distal lower extremity, bilateral: Secondary | ICD-10-CM

## 2019-12-23 LAB — CMP (CANCER CENTER ONLY)
ALT: 11 U/L (ref 0–44)
AST: 14 U/L — ABNORMAL LOW (ref 15–41)
Albumin: 3.9 g/dL (ref 3.5–5.0)
Alkaline Phosphatase: 63 U/L (ref 38–126)
Anion gap: 8 (ref 5–15)
BUN: 23 mg/dL (ref 8–23)
CO2: 28 mmol/L (ref 22–32)
Calcium: 10 mg/dL (ref 8.9–10.3)
Chloride: 103 mmol/L (ref 98–111)
Creatinine: 1.16 mg/dL — ABNORMAL HIGH (ref 0.44–1.00)
GFR, Est AFR Am: 51 mL/min — ABNORMAL LOW (ref >60)
GFR, Estimated: 44 mL/min — ABNORMAL LOW (ref >60)
Glucose, Bld: 92 mg/dL (ref 70–99)
Potassium: 4.4 mmol/L (ref 3.5–5.1)
Sodium: 139 mmol/L (ref 135–145)
Total Bilirubin: 0.4 mg/dL (ref 0.3–1.2)
Total Protein: 7.1 g/dL (ref 6.5–8.1)

## 2019-12-23 LAB — CBC WITH DIFFERENTIAL/PLATELET
Abs Immature Granulocytes: 0.02 10*3/uL (ref 0.00–0.07)
Basophils Absolute: 0.1 10*3/uL (ref 0.0–0.1)
Basophils Relative: 1 %
Eosinophils Absolute: 0.2 10*3/uL (ref 0.0–0.5)
Eosinophils Relative: 2 %
HCT: 37.4 % (ref 36.0–46.0)
Hemoglobin: 11.9 g/dL — ABNORMAL LOW (ref 12.0–15.0)
Immature Granulocytes: 0 %
Lymphocytes Relative: 32 %
Lymphs Abs: 2.8 10*3/uL (ref 0.7–4.0)
MCH: 30.7 pg (ref 26.0–34.0)
MCHC: 31.8 g/dL (ref 30.0–36.0)
MCV: 96.6 fL (ref 80.0–100.0)
Monocytes Absolute: 0.7 10*3/uL (ref 0.1–1.0)
Monocytes Relative: 8 %
Neutro Abs: 5.1 10*3/uL (ref 1.7–7.7)
Neutrophils Relative %: 57 %
Platelets: 257 10*3/uL (ref 150–400)
RBC: 3.87 MIL/uL (ref 3.87–5.11)
RDW: 12.8 % (ref 11.5–15.5)
WBC: 9 10*3/uL (ref 4.0–10.5)
nRBC: 0 % (ref 0.0–0.2)

## 2019-12-24 ENCOUNTER — Other Ambulatory Visit: Payer: PPO

## 2019-12-24 LAB — CARDIOLIPIN ANTIBODIES, IGG, IGM, IGA
Anticardiolipin IgA: <9 APL U/mL (ref 0–11)
Anticardiolipin IgG: <9 GPL U/mL (ref 0–14)
Anticardiolipin IgM: <9 MPL U/mL (ref 0–12)

## 2019-12-25 LAB — DRVVT MIX: dRVVT Mix: 45.8 s — ABNORMAL HIGH (ref 0.0–40.4)

## 2019-12-25 LAB — BETA-2-GLYCOPROTEIN I ABS, IGG/M/A
Beta-2 Glyco I IgG: <9 GPI IgG units (ref 0–20)
Beta-2-Glycoprotein I IgA: <9 GPI IgA units (ref 0–25)
Beta-2-Glycoprotein I IgM: <9 GPI IgM units (ref 0–32)

## 2019-12-25 LAB — LUPUS ANTICOAGULANT PANEL
DRVVT: 55 s — ABNORMAL HIGH (ref 0.0–47.0)
PTT Lupus Anticoagulant: 35.6 s (ref 0.0–51.9)

## 2019-12-25 LAB — DRVVT CONFIRM: dRVVT Confirm: 1 ratio (ref 0.8–1.2)

## 2019-12-25 LAB — PROTHROMBIN GENE MUTATION

## 2019-12-30 ENCOUNTER — Other Ambulatory Visit: Payer: Self-pay | Admitting: *Deleted

## 2019-12-30 ENCOUNTER — Telehealth: Payer: Self-pay | Admitting: *Deleted

## 2019-12-30 DIAGNOSIS — I824Z3 Acute embolism and thrombosis of unspecified deep veins of distal lower extremity, bilateral: Secondary | ICD-10-CM

## 2019-12-30 NOTE — Telephone Encounter (Addendum)
Daughter asks if next appt with Dr. Irene Limbo can be virtual/phone.Dr. Irene Limbo in agreement to phone appt and requests patient have [place] prior to appt with him. Patient had labs on 5/5. Scheduled [place] of Lower ext for 5/13 at 2P. Appt with Dr.Kale to be rescheduled. (Per Daughter, she is already off that day). LVM on named voice mail with appt info and requested daughter contact office to schedule f/u appt.  12/31/2019 - appt for scan confirmed with daughter. Schedule message sent to make appt for phone visit with Dr.Kale next week.

## 2019-12-31 ENCOUNTER — Ambulatory Visit: Payer: PPO | Admitting: Hematology

## 2019-12-31 ENCOUNTER — Other Ambulatory Visit: Payer: Self-pay

## 2019-12-31 ENCOUNTER — Telehealth: Payer: Self-pay | Admitting: *Deleted

## 2019-12-31 ENCOUNTER — Ambulatory Visit (HOSPITAL_COMMUNITY)
Admission: RE | Admit: 2019-12-31 | Discharge: 2019-12-31 | Disposition: A | Discharge status: Home / Self Care | Payer: PPO | Source: Ambulatory Visit | Attending: Hematology | Admitting: Hematology

## 2019-12-31 DIAGNOSIS — I824Z3 Acute embolism and thrombosis of unspecified deep veins of distal lower extremity, bilateral: Secondary | ICD-10-CM

## 2019-12-31 NOTE — Telephone Encounter (Signed)
1452: Contacted by Marya Amsler - Vascular Ultrasound. Bilateral lower extremity venous duplex has been completed - right side is clear, clot is seen on left.  Dr.Kale informed. Per Dr.Kale - patient can leave -continue blood thinners. Message given to Marya Amsler to relay to patient.

## 2019-12-31 NOTE — Progress Notes (Signed)
Bilateral lower extremity venous duplex has been completed. Preliminary results can be found in CV Proc through chart review.  Results were given to [facility] at Dr. Grier Mitts office.  12/31/19 2:47 PM Denise Hawkins RVT

## 2020-01-01 ENCOUNTER — Telehealth: Payer: Self-pay | Admitting: *Deleted

## 2020-01-01 ENCOUNTER — Telehealth: Payer: Self-pay | Admitting: Hematology

## 2020-01-01 NOTE — Telephone Encounter (Signed)
Patient's daughter called - wants to know if her mother needs to change her activity since she has a DVT in her left leg? ([place] yesterday) Contacted daughter with Dr.Kale's response. No answer - LVM on named voice mail:  Her rt leg DVT has resolved, left leg dvt about the same and non occlusive. If she is not having any new pain she is okay to walk and continue to stay physicially active, keep well hydrated, avoid crossing legs or squatting or sitting on the ground for now. Would also recommend using compression socks as previously discussed.  Continue Eliquis. Encouraged to contact office for further questions

## 2020-01-01 NOTE — Telephone Encounter (Signed)
Scheduled appt per 5/13 sch message - pt daughter aware of appt date and time

## 2020-01-06 ENCOUNTER — Telehealth: Payer: Self-pay | Admitting: Hematology

## 2020-01-06 NOTE — Progress Notes (Signed)
HEMATOLOGY/ONCOLOGY CONSULTATION NOTE  Date of Service: 01/07/2020  Patient Care Team: Vernie Shanks, MD as PCP - General (Family Medicine)  CHIEF COMPLAINTS/PURPOSE OF CONSULTATION:  B/L DVT  HISTORY OF PRESENTING ILLNESS:   I connected with Denise Hawkins on 01/07/20 at  1:40 PM EDT and verified that I am speaking with the correct person using two identifiers.   I discussed the limitations, risks, security and privacy concerns of performing an evaluation and management service by telemedicine and the availability of in-person appointments. I also discussed with the patient that there may be a patient responsible charge related to this service. The patient expressed understanding and agreed to proceed.   Other persons participating in the visit and their role in the encounter:     -Dawayne Cirri, Medical Scribe   Patient's location: Home  Provider's location: [place] [facility] is a wonderful 79 y.o. female who has been referred to [place] by Dr. Drema Dallas for evaluation and management of bilateral DVT. Pt is accompanied today by her daughter, Denise Hawkins. The patient's last visit with [place] was on 10/08/19. The pt reports that she is doing well overall.  The pt reports she is doing much better and feeling better. She got several scans of the chest, but there was no changes. Pt also saw GI doctor for abdominal pains and everything looked normal.  Her abdominal pain symptoms have resolved. Pt's husband is now in a nursing home and she was previously taking care of him. She has been on blood thinners for 4 months now. There has been no new leg swelling or discomfort in calves or thighs.   Of note since the patient's last visit, pt has had Lower Venous DVT Study completed on 12/31/19 with results revealing "RIGHT: There is no evidence of deep vein thrombosis in the lower extremity. No cystic structure found in the popliteal fossa. LEFT: Findings consistent with acute deep vein  thrombosis involving the left posterior tibial veins. No cystic structure found in the popliteal fossa."  Lab results today (12/23/19) of CBC w/diff and CMP is as follows: all values are WNL except for Hemoglobin at 11.9, BUN At 1.16, AST at 14, GFR, Est Non Af Am at 44, GFR, Est AFR Am at 51 12/23/19 of Beta-2-Glycoprotein I Abs, IgI/M/A is as follows: all values are  12/23/19 of Lupus Anticoagulant Panel is as follows: all values are WNL except for DRVVT at 55.0 12/23/19 of dRVVT Mix at 45. 12/23/19 of dRVVT Confirm at 1.0: WNL 12/23/19 of Cardiolipin Antibodies, IgG, IgM, IgA is as follows: all values are  12/23/19 of Prothrombin Gene Mutation is as follows: Negative  On review of systems, pt denies SOB, abdominal pain and any other symptoms.    MEDICAL HISTORY:  Past Medical History:  Diagnosis Date  . Anxiety   . Arthritis   . Gastritis   . GERD (gastroesophageal reflux disease)   . GERD (gastroesophageal reflux disease)   . Headache(784.0)    migraines  . Hypertension   . PONV (postoperative nausea and vomiting)     SURGICAL HISTORY: Past Surgical History:  Procedure Laterality Date  . APPENDECTOMY    . BACK SURGERY    . BREAST SURGERY     right biopsy-benign  . COLONOSCOPY    . DILATION AND CURETTAGE OF UTERUS     x4  . ESOPHAGOGASTRODUODENOSCOPY    . EYE SURGERY     left with lens implant  . SUBMANDIBULAR  GLAND EXCISION Right 11/20/2017   Procedure: EXCISION  OF RIGHT SUBMANDIBULAR GLAND;  Surgeon: Jerrell Belfast, MD;  Location: [facility];  Service: ENT;  Laterality: Right;  . TONSILLECTOMY    . TOTAL HIP ARTHROPLASTY Right 07/07/2013   Procedure: RIGHT TOTAL HIP ARTHROPLASTY ANTERIOR APPROACH;  Surgeon: Mauri Pole, MD;  Location: [facility];  Service: Orthopedics;  Laterality: Right;    SOCIAL HISTORY: Social History   Socioeconomic History  . Marital status: Married    Spouse name: Not on file  . Number of children: Not on file  . Years of education: Not on  file  . Highest education level: Not on file  Occupational History  . Not on file  Tobacco Use  . Smoking status: Never Smoker  . Smokeless tobacco: Never Used  Substance and Sexual Activity  . Alcohol use: No  . Drug use: No  . Sexual activity: Not on file  Other Topics Concern  . Not on file  Social History Narrative  . Not on file   Social Determinants of Health   Financial Resource Strain:   . Difficulty of Paying Living Expenses:   Food Insecurity:   . Worried About Charity fundraiser in the Last Year:   . Arboriculturist in the Last Year:   Transportation Needs:   . Film/video editor (Medical):   Marland Kitchen Lack of Transportation (Non-Medical):   Physical Activity:   . Days of Exercise per Week:   . Minutes of Exercise per Session:   Stress:   . Feeling of Stress :   Social Connections:   . Frequency of Communication with Friends and Family:   . Frequency of Social Gatherings with Friends and Family:   . Attends Religious Services:   . Active Member of Clubs or Organizations:   . Attends Archivist Meetings:   Marland Kitchen Marital Status:   Intimate Partner Violence:   . Fear of Current or Ex-Partner:   . Emotionally Abused:   Marland Kitchen Physically Abused:   . Sexually Abused:     FAMILY HISTORY: Family History  Problem Relation Age of Onset  . Heart disease Father     ALLERGIES:  is allergic to latex; penicillins; and sulfa antibiotics.  MEDICATIONS:  Current Outpatient Medications  Medication Sig Dispense Refill  . acetaminophen (TYLENOL) 500 MG tablet Take 500 mg by mouth every 6 (six) hours as needed (for pain.).    Marland Kitchen amLODipine (NORVASC) 2.5 MG tablet Take 2.5 mg by mouth daily.    Marland Kitchen apixaban (ELIQUIS) 5 MG TABS tablet Take 1 tablet by mouth daily.    Marland Kitchen atenolol (TENORMIN) 25 MG tablet Take 25 mg by mouth every morning.    . Chlorpheniramine-DM (CORICIDIN HBP COUGH/COLD PO) Take 1 tablet by mouth every 6 (six) hours as needed (for cold/congestion.).    Marland Kitchen  Docusate Sodium (PHILLIPS LIQUI-GELS PO) Take 1 capsule by mouth daily as needed (for constipation.).    Marland Kitchen enalapril (VASOTEC) 20 MG tablet Take 20 mg by mouth 2 (two) times daily.    Marland Kitchen escitalopram (LEXAPRO) 10 MG tablet Take 10 mg by mouth daily.    . hydrochlorothiazide (MICROZIDE) 12.5 MG capsule Take 12.5 mg by mouth daily.    Marland Kitchen HYDROcodone-acetaminophen (NORCO/VICODIN) 5-325 MG tablet Take 1-2 tablets by mouth every 4 (four) hours as needed for moderate pain. 30 tablet 0  . LORazepam (ATIVAN) 0.5 MG tablet Take 0.5 mg by mouth 2 (two) times daily as needed (for anxiety/migraine  headaches.).     Marland Kitchen omeprazole (PRILOSEC) 20 MG capsule Take 20 mg by mouth daily before breakfast.     . Probiotic Product (PROBIOTIC PO) Take 1 capsule by mouth daily.    . sucralfate (CARAFATE) 1 g tablet Take 1 g by mouth 4 (four) times daily -  before meals and at bedtime.     No current facility-administered medications for this visit.    REVIEW OF SYSTEMS:   A 10+ POINT REVIEW OF SYSTEMS WAS OBTAINED including neurology, dermatology, psychiatry, cardiac, respiratory, lymph, extremities, GI, GU, Musculoskeletal, constitutional, breasts, reproductive, HEENT.  All pertinent positives are noted in the HPI.  All others are negative.   PHYSICAL EXAMINATION: ECOG PERFORMANCE STATUS: 2 - Symptomatic, <50% confined to bed  . There were no vitals filed for this visit. There were no vitals filed for this visit. .There is no height or weight on file to calculate BMI.  Telehealth Visit   LABORATORY DATA:  I have reviewed the data as listed  . CBC Latest Ref Rng & Units 12/23/2019 11/20/2017 11/15/2017  WBC 4.0 - 10.5 K/uL 9.0 11.9(H) 8.7  Hemoglobin 12.0 - 15.0 g/dL 11.9(L) 11.7(L) 12.0  Hematocrit 36.0 - 46.0 % 37.4 35.6(L) 37.8  Platelets 150 - 400 K/uL 257 214 231    . CMP Latest Ref Rng & Units 12/23/2019 11/20/2017 11/15/2017  Glucose 70 - 99 mg/dL 92 - 98  BUN 8 - 23 mg/dL 23 - 20  Creatinine 0.44 - 1.00  mg/dL 1.16(H) 0.97 1.11(H)  Sodium 135 - 145 mmol/L 139 - 139  Potassium 3.5 - 5.1 mmol/L 4.4 - 4.7  Chloride 98 - 111 mmol/L 103 - 104  CO2 22 - 32 mmol/L 28 - 28  Calcium 8.9 - 10.3 mg/dL 10.0 - 9.8  Total Protein 6.5 - 8.1 g/dL 7.1 - -  Total Bilirubin 0.3 - 1.2 mg/dL 0.4 - -  Alkaline Phos 38 - 126 U/L 63 - -  AST 15 - 41 U/L 14(L) - -  ALT 0 - 44 U/L 11 - -     RADIOGRAPHIC STUDIES: I have personally reviewed the radiological images as listed and agreed with the findings in the report. VAS [place] LOWER EXTREMITY VENOUS (DVT)  Result Date: 12/31/2019  Lower Venous DVTStudy Indications: History of DVT.  Risk Factors: DVT. Performing Technologist: Oliver Hum RVT  Examination Guidelines: A complete evaluation includes B-mode imaging, spectral Doppler, color Doppler, and power Doppler as needed of all accessible portions of each vessel. Bilateral testing is considered an integral part of a complete examination. Limited examinations for reoccurring indications may be performed as noted. The reflux portion of the exam is performed with the patient in reverse Trendelenburg.  +---------+---------------+---------+-----------+----------+--------------+ RIGHT    CompressibilityPhasicitySpontaneityPropertiesThrombus Aging +---------+---------------+---------+-----------+----------+--------------+ CFV      Full           Yes      Yes                                 +---------+---------------+---------+-----------+----------+--------------+ SFJ      Full                                                        +---------+---------------+---------+-----------+----------+--------------+ FV Prox  Full                                                        +---------+---------------+---------+-----------+----------+--------------+  FV Mid   Full                                                        +---------+---------------+---------+-----------+----------+--------------+ FV  DistalFull                                                        +---------+---------------+---------+-----------+----------+--------------+ PFV      Full                                                        +---------+---------------+---------+-----------+----------+--------------+ POP      Full           Yes      Yes                                 +---------+---------------+---------+-----------+----------+--------------+ PTV      Full                                                        +---------+---------------+---------+-----------+----------+--------------+ PERO     Full                                                        +---------+---------------+---------+-----------+----------+--------------+   +---------+---------------+---------+-----------+----------+--------------+ LEFT     CompressibilityPhasicitySpontaneityPropertiesThrombus Aging +---------+---------------+---------+-----------+----------+--------------+ CFV      Full           Yes      Yes                                 +---------+---------------+---------+-----------+----------+--------------+ SFJ      Full                                                        +---------+---------------+---------+-----------+----------+--------------+ FV Prox  Full                                                        +---------+---------------+---------+-----------+----------+--------------+ FV Mid   Full                                                        +---------+---------------+---------+-----------+----------+--------------+  FV DistalFull                                                        +---------+---------------+---------+-----------+----------+--------------+ PFV      Full                                                        +---------+---------------+---------+-----------+----------+--------------+ POP      Full           Yes      Yes                                  +---------+---------------+---------+-----------+----------+--------------+ PTV      None                                         Acute          +---------+---------------+---------+-----------+----------+--------------+ PERO     Full                                                        +---------+---------------+---------+-----------+----------+--------------+     Summary: RIGHT: - There is no evidence of deep vein thrombosis in the lower extremity.  - No cystic structure found in the popliteal fossa.  LEFT: - Findings consistent with acute deep vein thrombosis involving the left posterior tibial veins. - No cystic structure found in the popliteal fossa.  *See table(s) above for measurements and observations. Electronically signed by Servando Snare MD on 12/31/2019 at 5:40:26 PM.    Final     ASSESSMENT & PLAN:   79 yo with   1) Unprovoked b/l lower extremity DVT  PLAN: -Discussed pt labwork today, 12/23/19; of CBC w/diff and CMP is as follows: all values are WNL except for Hemoglobin at 11.9, BUN At 1.16, AST at 14, GFR, Est Non Af Am at 44, GFR, Est AFR Am at 51 -Discussed 12/23/19 of Beta-2-Glycoprotein I Abs, IgI/M/A are WNL -Discussed 12/23/19 lupus anticoagulant neg -Discussed 12/23/19 of Cardiolipin Antibodies, IgG, IgM, IgA- WNL -Discussed 12/23/19 of Prothrombin Gene Mutation is as follows: Negative -Discussed 12/31/19 of Lower Venous DVT Study  -Advised clot in left leg is probably chronic due to no new symptoms -Advised on pt traveling 2.5 hour drive- possible if pt stays hydrated, takes 5 minute break every hour to walk, using compression socks   -Advised if chronic clot would switch to aspirin  -Recommend pt wear compression socks, elevate legs, stay well hydrated and avoid folding legs to minimize risk factors  -Recommend pt pay attention to her leg symptomatology when deciding what activities to complete  -Recommend pt stay up to date with  age-appropriate cancer screenings with PCP -Recommends 3 more months of Eliquis then rescanning  -Will get [place] LLE and labs in 3 months -if stable then switch to baby aspirin or  preventive dose of Eliquis   FOLLOW UP: Labs and [place] LLE in 3 months Phone visit with Dr Irene Limbo in 14 weeks   The total time spent in the appt was 20 minutes and more than 50% was on counseling and direct patient cares.  All of the patient's questions were answered with apparent satisfaction. The patient knows to call the clinic with any problems, questions or concerns.   Sullivan Lone MD [place] AAHIVMS [facility] Hematology/Oncology Physician [facility]  (Office):       [phone] (Work cell):  [phone] (Fax):           [phone]  01/07/2020 2:23 PM  I, Dawayne Cirri am acting as a Education administrator for Dr. Sullivan Lone.   .I have reviewed the above documentation for accuracy and completeness, and I agree with the above. Brunetta Genera MD

## 2020-01-07 ENCOUNTER — Inpatient Hospital Stay (HOSPITAL_BASED_OUTPATIENT_CLINIC_OR_DEPARTMENT_OTHER): Payer: PPO | Admitting: Hematology

## 2020-01-07 ENCOUNTER — Telehealth: Payer: Self-pay | Admitting: Hematology

## 2020-01-07 ENCOUNTER — Other Ambulatory Visit: Payer: Self-pay

## 2020-01-07 DIAGNOSIS — I824Z3 Acute embolism and thrombosis of unspecified deep veins of distal lower extremity, bilateral: Secondary | ICD-10-CM

## 2020-01-07 DIAGNOSIS — Z86711 Personal history of pulmonary embolism: Secondary | ICD-10-CM | POA: Diagnosis not present

## 2020-01-08 DIAGNOSIS — L739 Follicular disorder, unspecified: Secondary | ICD-10-CM | POA: Diagnosis not present

## 2020-01-08 DIAGNOSIS — L03811 Cellulitis of head [any part, except face]: Secondary | ICD-10-CM | POA: Diagnosis not present

## 2020-01-13 DIAGNOSIS — E7800 Pure hypercholesterolemia, unspecified: Secondary | ICD-10-CM | POA: Diagnosis not present

## 2020-01-13 DIAGNOSIS — F4321 Adjustment disorder with depressed mood: Secondary | ICD-10-CM | POA: Diagnosis not present

## 2020-01-13 DIAGNOSIS — I10 Essential (primary) hypertension: Secondary | ICD-10-CM | POA: Diagnosis not present

## 2020-02-17 DIAGNOSIS — F4321 Adjustment disorder with depressed mood: Secondary | ICD-10-CM | POA: Diagnosis not present

## 2020-02-17 DIAGNOSIS — I10 Essential (primary) hypertension: Secondary | ICD-10-CM | POA: Diagnosis not present

## 2020-02-17 DIAGNOSIS — E7800 Pure hypercholesterolemia, unspecified: Secondary | ICD-10-CM | POA: Diagnosis not present

## 2020-02-26 DIAGNOSIS — K5909 Other constipation: Secondary | ICD-10-CM | POA: Diagnosis not present

## 2020-02-26 DIAGNOSIS — Z79899 Other long term (current) drug therapy: Secondary | ICD-10-CM | POA: Diagnosis not present

## 2020-02-26 DIAGNOSIS — E7800 Pure hypercholesterolemia, unspecified: Secondary | ICD-10-CM | POA: Diagnosis not present

## 2020-02-26 DIAGNOSIS — F4321 Adjustment disorder with depressed mood: Secondary | ICD-10-CM | POA: Diagnosis not present

## 2020-02-26 DIAGNOSIS — I824Z3 Acute embolism and thrombosis of unspecified deep veins of distal lower extremity, bilateral: Secondary | ICD-10-CM | POA: Diagnosis not present

## 2020-02-26 DIAGNOSIS — I10 Essential (primary) hypertension: Secondary | ICD-10-CM | POA: Diagnosis not present

## 2020-02-26 DIAGNOSIS — K2100 Gastro-esophageal reflux disease with esophagitis, without bleeding: Secondary | ICD-10-CM | POA: Diagnosis not present

## 2020-03-16 ENCOUNTER — Telehealth: Payer: Self-pay | Admitting: Hematology

## 2020-03-16 NOTE — Telephone Encounter (Signed)
Called pt per 7/27 sch msg - no answer. Left message for patient to call back to reschedule appt.

## 2020-03-18 DIAGNOSIS — E7800 Pure hypercholesterolemia, unspecified: Secondary | ICD-10-CM | POA: Diagnosis not present

## 2020-03-18 DIAGNOSIS — F4321 Adjustment disorder with depressed mood: Secondary | ICD-10-CM | POA: Diagnosis not present

## 2020-03-18 DIAGNOSIS — I10 Essential (primary) hypertension: Secondary | ICD-10-CM | POA: Diagnosis not present

## 2020-03-30 ENCOUNTER — Telehealth: Payer: Self-pay | Admitting: Hematology

## 2020-03-31 ENCOUNTER — Inpatient Hospital Stay: Payer: PPO | Admitting: Hematology

## 2020-03-31 ENCOUNTER — Telehealth: Payer: Self-pay | Admitting: *Deleted

## 2020-03-31 NOTE — Telephone Encounter (Signed)
Per Dr. Irene Limbo - cxl today's phone appt - patient has not had [place] LE or labs (per 5/20 LOS). Contacted daughter Ms. Ferdinand Lango - she stated no one contacted her to schedule any appointments.  Contacted [facility] [place] - [place] scheduled patient for [place] on 8/16 at 10am. Lab appt scheduled 8/16 at [place] at 0915.  Contacted daughter with appt times and med directions. Advised patient to continue Eliquis until MD reviews test results with her/daughter. She verbalized understanding of medication instructions and appt times. Schedule message sent to schedule phone appt with Dr. Irene Limbo later next week. Daughter knows to expect a call regarding appt.

## 2020-04-01 ENCOUNTER — Telehealth: Payer: Self-pay | Admitting: Hematology

## 2020-04-01 NOTE — Telephone Encounter (Signed)
Called pt per 8/12 sch msg :  Scheduling Message Entered by Rolland Bimler on 03/31/2020 at 12:53 PM Priority: Routine <No visit type provided>  Department: [facility]  Provider:   Scheduling Notes:  Please contact daughter (number listed) to schedule phone appt with Dr. Irene Limbo for patient week of 8/16 - follwoing labs and [place] on 8/16.  Thanks!     Spoke with daughter and she said she will give [place] a call back when she is in town to set something up . Unable to schedule at the moment.

## 2020-04-04 ENCOUNTER — Inpatient Hospital Stay: Payer: PPO | Attending: Hematology

## 2020-04-04 ENCOUNTER — Other Ambulatory Visit: Payer: Self-pay

## 2020-04-04 ENCOUNTER — Ambulatory Visit (HOSPITAL_COMMUNITY)
Admission: RE | Admit: 2020-04-04 | Discharge: 2020-04-04 | Disposition: A | Discharge status: Home / Self Care | Payer: PPO | Source: Ambulatory Visit | Attending: Hematology | Admitting: Hematology

## 2020-04-04 ENCOUNTER — Telehealth: Payer: Self-pay | Admitting: *Deleted

## 2020-04-04 DIAGNOSIS — Z86718 Personal history of other venous thrombosis and embolism: Secondary | ICD-10-CM | POA: Insufficient documentation

## 2020-04-04 DIAGNOSIS — I824Z3 Acute embolism and thrombosis of unspecified deep veins of distal lower extremity, bilateral: Secondary | ICD-10-CM

## 2020-04-04 DIAGNOSIS — Z86711 Personal history of pulmonary embolism: Secondary | ICD-10-CM | POA: Diagnosis not present

## 2020-04-04 DIAGNOSIS — Z7901 Long term (current) use of anticoagulants: Secondary | ICD-10-CM | POA: Diagnosis not present

## 2020-04-04 LAB — CBC WITH DIFFERENTIAL/PLATELET
Abs Immature Granulocytes: 0.02 10*3/uL (ref 0.00–0.07)
Basophils Absolute: 0.1 10*3/uL (ref 0.0–0.1)
Basophils Relative: 1 %
Eosinophils Absolute: 0.1 10*3/uL (ref 0.0–0.5)
Eosinophils Relative: 2 %
HCT: 37.7 % (ref 36.0–46.0)
Hemoglobin: 12.1 g/dL (ref 12.0–15.0)
Immature Granulocytes: 0 %
Lymphocytes Relative: 34 %
Lymphs Abs: 2.9 10*3/uL (ref 0.7–4.0)
MCH: 30.4 pg (ref 26.0–34.0)
MCHC: 32.1 g/dL (ref 30.0–36.0)
MCV: 94.7 fL (ref 80.0–100.0)
Monocytes Absolute: 0.7 10*3/uL (ref 0.1–1.0)
Monocytes Relative: 9 %
Neutro Abs: 4.7 10*3/uL (ref 1.7–7.7)
Neutrophils Relative %: 54 %
Platelets: 209 10*3/uL (ref 150–400)
RBC: 3.98 MIL/uL (ref 3.87–5.11)
RDW: 13 % (ref 11.5–15.5)
WBC: 8.5 10*3/uL (ref 4.0–10.5)
nRBC: 0 % (ref 0.0–0.2)

## 2020-04-04 LAB — D-DIMER, QUANTITATIVE: D-Dimer, Quant: 0.36 ug/mL-FEU (ref 0.00–0.50)

## 2020-04-04 LAB — CMP (CANCER CENTER ONLY)
ALT: 14 U/L (ref 0–44)
AST: 16 U/L (ref 15–41)
Albumin: 3.8 g/dL (ref 3.5–5.0)
Alkaline Phosphatase: 63 U/L (ref 38–126)
Anion gap: 8 (ref 5–15)
BUN: 25 mg/dL — ABNORMAL HIGH (ref 8–23)
CO2: 26 mmol/L (ref 22–32)
Calcium: 10.2 mg/dL (ref 8.9–10.3)
Chloride: 103 mmol/L (ref 98–111)
Creatinine: 1.2 mg/dL — ABNORMAL HIGH (ref 0.44–1.00)
GFR, Est AFR Am: 49 mL/min — ABNORMAL LOW (ref >60)
GFR, Estimated: 43 mL/min — ABNORMAL LOW (ref >60)
Glucose, Bld: 80 mg/dL (ref 70–99)
Potassium: 4.9 mmol/L (ref 3.5–5.1)
Sodium: 137 mmol/L (ref 135–145)
Total Bilirubin: 0.4 mg/dL (ref 0.3–1.2)
Total Protein: 7 g/dL (ref 6.5–8.1)

## 2020-04-04 NOTE — Telephone Encounter (Signed)
Contacted by Denise Hawkins with [facility] with results of LLE ultrasound: left calf was positive for DVT in May. Findings consistent with chronic deep vein thrombosis involving the left posterior tibial veins. No acute changes, continues chronic.  RIGHT: No evidence of common femoral vein obstruction. Dr.Kale informed. No orders received.

## 2020-04-04 NOTE — Progress Notes (Signed)
Left lower extremity venous duplex has been completed. Preliminary results can be found in CV Proc through chart review.  Results were given to [facility] at Dr. Grier Mitts office.  04/04/20 9:39 AM Carlos Levering RVT

## 2020-04-05 ENCOUNTER — Ambulatory Visit: Payer: PPO | Admitting: Hematology

## 2020-04-06 NOTE — Progress Notes (Signed)
This encounter was created in error - please disregard.

## 2020-05-03 ENCOUNTER — Telehealth: Payer: Self-pay | Admitting: Hematology

## 2020-05-03 ENCOUNTER — Inpatient Hospital Stay: Payer: PPO | Attending: Hematology | Admitting: Hematology

## 2020-05-03 DIAGNOSIS — I824Z3 Acute embolism and thrombosis of unspecified deep veins of distal lower extremity, bilateral: Secondary | ICD-10-CM | POA: Diagnosis not present

## 2020-05-03 MED ORDER — ASPIRIN EC 81 MG PO TBEC
81.0000 mg | DELAYED_RELEASE_TABLET | Freq: Every day | ORAL | 11 refills | Status: DC
Start: 2020-05-03 — End: 2022-04-02

## 2020-05-03 NOTE — Progress Notes (Signed)
HEMATOLOGY/ONCOLOGY CONSULTATION NOTE  Date of Service: 05/03/2020  Patient Care Team: Vernie Shanks, MD as PCP - General (Family Medicine)  CHIEF COMPLAINTS/PURPOSE OF CONSULTATION:  B/L DVT  HISTORY OF PRESENTING ILLNESS:  Denise Hawkins is a wonderful 79 y.o. female who has been referred to [place] by Dr. Drema Dallas for evaluation and management of bilateral DVT. Pt is accompanied today by her daughter, Denise Hawkins. The pt reports that she is doing well overall.  The pt reports that she has had no previous blood clots and no family history of blood clots. She notes that her grandmother did have varicose veins. Pt has been pregnant twice and has two children. She has not had a mammogram in the last few years and denies any new breast symptoms.   In November pt began to feel very weak and tired. She was also having stomach pain, gas and nausea for which she had a workup with Dr. Alessandra Bevels. She has continued to have upper abdominal discomfort that is worsened after spicy food. She has had imaging but has not had an Endoscopy recently. She had an Endoscopy many years ago after similar stomach discomfort and was found to have Gastritis. After her 12/22 CT C/A/P noted changes in her lung she went to see a Pulmonologist. The Pulmonologist found lung inflammation but did not give a specific diagnosis. She plans to do repeat scans in a few months. She has lost about 10 lbs in the last 6 months, but pt attributes this to stress and stomach symptoms. Pt had to do a lot of healthcare for her husband over the past two years.   Pt was having swelling in both of her legs, worse in the right for quite a bit before her blood clot was found. She had a right hip replacement in 2014 and her right leg has been swollen since then. When the right calf became painful to the touch it prompted her first Venous US, where she was found to have a blood clot in her right leg. She had no symptoms in her left leg this time. Pt  was then placed on Eliquis. She was taking 5 mg twice daily, as prescribed, and two weeks later she began to feel symptoms in her left leg that were similar to her right leg. During this time pt was walking a lot, although she was still experiencing right leg pain and swelling. After her second US Venous it was discovered that she had a blood clot in her left leg and she was switched to Xarelto. She notes improvement in leg pain and swelling after starting Xarelto. Pt was not on Asprin prior to her DVTs and notes that her right leg is still sore.  Of note prior to the patient's visit today, pt has had US Venous ([id number]) completed on 09/08/2019 with results revealing "Acute deep venous thrombosis is noted in the right popliteal and gastrocnemius veins."   Pt has had US Venous ([id number]) completed on 09/24/2019 with results revealing "Nonocclusive age-indeterminate calf region DVT in the tibial and peroneal veins with partial compressibility. Very minor thrombus burden. No propagation into the popliteal and femoral veins."  Most recent lab results (09/08/2019) of CBC w/diff and CMP is as follows: all values are WNL except for Calcium at 10.4, GFR Est Non Af Am at 53. 09/08/2019 Factor V Leiden is "Negative" 09/08/2019 aPTT at 22, INR at 1.0, Prothromin Time at 10.4,  09/08/2019 Antithrombin Activity at 112, Antithrombin Antigen at 104 09/08/2019 Protein  S free at 139, Protein S total at 131, Protein S func at 94, Protein C func at 192, Protein C antigen at 143  On review of systems, pt reports weakness, fatigue, right calf soreness and denies cough, fevers, wheezing, chest pain, SOB, urinary habit changes, bowel habit changes, breast lumps/bumps and any other symptoms.   On PMHx the pt reports Anxiety, Gastritis, EGD, Right Total Hip Arthroplasty, Migraines.  INTERVAL HISTORY: I connected with  CHARLENE DETTER on 05/03/20 by telephone and verified that I am speaking with the correct person  using two identifiers.   I discussed the limitations of evaluation and management by telemedicine. The patient expressed understanding and agreed to proceed.  Other persons participating in the visit and their role in the encounter:     -Yevette Edwards, Medical Scribe  Patient's location: Home Provider's location: [facility] [employer] is a wonderful 79 y.o. female who is here for evaluation and management of bilateral DVT. The patient's last visit with [place] was on 01/07/2020. The pt reports that she is doing well overall.  The pt reports that the swelling in her legs have completely resolved. She denies any pain or discoloration in her lower extremities. Pt has continued taking Eliquis as prescribed. Pt also tries to remain active. She denies any history of gastric ulcers.   Of note since the patient's last visit, pt has had [place] Lower Extremity Venous (DVT) ([id number]) completed on 04/04/2020 with results revealing "RIGHT: - No evidence of common femoral vein obstruction. LEFT: - Findings consistent with chronic deep vein thrombosis involving the left posterior tibial veins. - No cystic structure found in the popliteal fossa."  Lab results (04/04/20) of CBC w/diff and CMP is as follows: all values are WNL except for BUN at 25, Creatinine at 1.20, GFR Est Non Af Am at 43. 04/04/2020 D-dimer at 0.36  On review of systems, pt denies leg swelling, leg pain, discoloration of legs/feet, abnormal/excessive bleeding and any other symptoms.   MEDICAL HISTORY:  Past Medical History:  Diagnosis Date  . Anxiety   . Arthritis   . Gastritis   . GERD (gastroesophageal reflux disease)   . GERD (gastroesophageal reflux disease)   . Headache(784.0)    migraines  . Hypertension   . PONV (postoperative nausea and vomiting)     SURGICAL HISTORY: Past Surgical History:  Procedure Laterality Date  . APPENDECTOMY    . BACK SURGERY    . BREAST SURGERY     right biopsy-benign  .  COLONOSCOPY    . DILATION AND CURETTAGE OF UTERUS     x4  . ESOPHAGOGASTRODUODENOSCOPY    . EYE SURGERY     left with lens implant  . SUBMANDIBULAR GLAND EXCISION Right 11/20/2017   Procedure: EXCISION  OF RIGHT SUBMANDIBULAR GLAND;  Surgeon: Jerrell Belfast, MD;  Location: [place];  Service: ENT;  Laterality: Right;  . TONSILLECTOMY    . TOTAL HIP ARTHROPLASTY Right 07/07/2013   Procedure: RIGHT TOTAL HIP ARTHROPLASTY ANTERIOR APPROACH;  Surgeon: Mauri Pole, MD;  Location: [facility];  Service: Orthopedics;  Laterality: Right;    SOCIAL HISTORY: Social History   Socioeconomic History  . Marital status: Married    Spouse name: Not on file  . Number of children: Not on file  . Years of education: Not on file  . Highest education level: Not on file  Occupational History  . Not on file  Tobacco Use  . Smoking status: Never Smoker  .  Smokeless tobacco: Never Used  Vaping Use  . Vaping Use: Never used  Substance and Sexual Activity  . Alcohol use: No  . Drug use: No  . Sexual activity: Not on file  Other Topics Concern  . Not on file  Social History Narrative  . Not on file   Social Determinants of Health   Financial Resource Strain:   . Difficulty of Paying Living Expenses: Not on file  Food Insecurity:   . Worried About Charity fundraiser in the Last Year: Not on file  . Ran Out of Food in the Last Year: Not on file  Transportation Needs:   . Lack of Transportation (Medical): Not on file  . Lack of Transportation (Non-Medical): Not on file  Physical Activity:   . Days of Exercise per Week: Not on file  . Minutes of Exercise per Session: Not on file  Stress:   . Feeling of Stress : Not on file  Social Connections:   . Frequency of Communication with Friends and Family: Not on file  . Frequency of Social Gatherings with Friends and Family: Not on file  . Attends Religious Services: Not on file  . Active Member of Clubs or Organizations: Not on file  . Attends English as a second language teacher Meetings: Not on file  . Marital Status: Not on file  Intimate Partner Violence:   . Fear of Current or Ex-Partner: Not on file  . Emotionally Abused: Not on file  . Physically Abused: Not on file  . Sexually Abused: Not on file    FAMILY HISTORY: Family History  Problem Relation Age of Onset  . Heart disease Father     ALLERGIES:  is allergic to latex, penicillins, and sulfa antibiotics.  MEDICATIONS:  Current Outpatient Medications  Medication Sig Dispense Refill  . acetaminophen (TYLENOL) 500 MG tablet Take 500 mg by mouth every 6 (six) hours as needed (for pain.).    Marland Kitchen amLODipine (NORVASC) 2.5 MG tablet Take 2.5 mg by mouth daily.    Marland Kitchen aspirin EC 81 MG tablet Take 1 tablet (81 mg total) by mouth daily. Swallow whole. 30 tablet 11  . atenolol (TENORMIN) 25 MG tablet Take 25 mg by mouth every morning.    . Chlorpheniramine-DM (CORICIDIN HBP COUGH/COLD PO) Take 1 tablet by mouth every 6 (six) hours as needed (for cold/congestion.).    Marland Kitchen Docusate Sodium (PHILLIPS LIQUI-GELS PO) Take 1 capsule by mouth daily as needed (for constipation.).    Marland Kitchen enalapril (VASOTEC) 20 MG tablet Take 20 mg by mouth 2 (two) times daily.    Marland Kitchen escitalopram (LEXAPRO) 10 MG tablet Take 10 mg by mouth daily.    . hydrochlorothiazide (MICROZIDE) 12.5 MG capsule Take 12.5 mg by mouth daily.    Marland Kitchen HYDROcodone-acetaminophen (NORCO/VICODIN) 5-325 MG tablet Take 1-2 tablets by mouth every 4 (four) hours as needed for moderate pain. 30 tablet 0  . LORazepam (ATIVAN) 0.5 MG tablet Take 0.5 mg by mouth 2 (two) times daily as needed (for anxiety/migraine headaches.).     Marland Kitchen omeprazole (PRILOSEC) 20 MG capsule Take 20 mg by mouth daily before breakfast.     . Probiotic Product (PROBIOTIC PO) Take 1 capsule by mouth daily.    . sucralfate (CARAFATE) 1 g tablet Take 1 g by mouth 4 (four) times daily -  before meals and at bedtime.     No current facility-administered medications for this visit.    REVIEW  OF SYSTEMS:   A 10+ POINT REVIEW  OF SYSTEMS WAS OBTAINED including neurology, dermatology, psychiatry, cardiac, respiratory, lymph, extremities, GI, GU, Musculoskeletal, constitutional, breasts, reproductive, HEENT.  All pertinent positives are noted in the HPI.  All others are negative.   PHYSICAL EXAMINATION: ECOG PERFORMANCE STATUS: 2 - Symptomatic, <50% confined to bed  . There were no vitals filed for this visit. There were no vitals filed for this visit. .There is no height or weight on file to calculate BMI.  Telehealth visit   LABORATORY DATA:  I have reviewed the data as listed  . CBC Latest Ref Rng & Units 04/04/2020 12/23/2019 11/20/2017  WBC 4.0 - 10.5 K/uL 8.5 9.0 11.9(H)  Hemoglobin 12.0 - 15.0 g/dL 12.1 11.9(L) 11.7(L)  Hematocrit 36 - 46 % 37.7 37.4 35.6(L)  Platelets 150 - 400 K/uL 209 257 214    . CMP Latest Ref Rng & Units 04/04/2020 12/23/2019 11/20/2017  Glucose 70 - 99 mg/dL 80 92 -  BUN 8 - 23 mg/dL 25(H) 23 -  Creatinine 0.44 - 1.00 mg/dL 1.20(H) 1.16(H) 0.97  Sodium 135 - 145 mmol/L 137 139 -  Potassium 3.5 - 5.1 mmol/L 4.9 4.4 -  Chloride 98 - 111 mmol/L 103 103 -  CO2 22 - 32 mmol/L 26 28 -  Calcium 8.9 - 10.3 mg/dL 10.2 10.0 -  Total Protein 6.5 - 8.1 g/dL 7.0 7.1 -  Total Bilirubin 0.3 - 1.2 mg/dL 0.4 0.4 -  Alkaline Phos 38 - 126 U/L 63 63 -  AST 15 - 41 U/L 16 14(L) -  ALT 0 - 44 U/L 14 11 -     RADIOGRAPHIC STUDIES: I have personally reviewed the radiological images as listed and agreed with the findings in the report. VAS [place] LOWER EXTREMITY VENOUS (DVT)  Result Date: 04/04/2020  Lower Venous DVTStudy Indications: History of DVT.  Anticoagulation: Eliquis. Comparison Study: 12/31/2019 - Left PTV DVT Performing Technologist: Oliver Hum RVT  Examination Guidelines: A complete evaluation includes B-mode imaging, spectral Doppler, color Doppler, and power Doppler as needed of all accessible portions of each vessel. Bilateral testing is considered an  integral part of a complete examination. Limited examinations for reoccurring indications may be performed as noted. The reflux portion of the exam is performed with the patient in reverse Trendelenburg.  +-----+---------------+---------+-----------+----------+--------------+ RIGHTCompressibilityPhasicitySpontaneityPropertiesThrombus Aging +-----+---------------+---------+-----------+----------+--------------+ CFV  Full           Yes      Yes                                 +-----+---------------+---------+-----------+----------+--------------+   +---------+---------------+---------+-----------+----------+--------------+ LEFT     CompressibilityPhasicitySpontaneityPropertiesThrombus Aging +---------+---------------+---------+-----------+----------+--------------+ CFV      Full           Yes      Yes                                 +---------+---------------+---------+-----------+----------+--------------+ SFJ      Full                                                        +---------+---------------+---------+-----------+----------+--------------+ FV Prox  Full                                                        +---------+---------------+---------+-----------+----------+--------------+  FV Mid   Full                                                        +---------+---------------+---------+-----------+----------+--------------+ FV DistalFull                                                        +---------+---------------+---------+-----------+----------+--------------+ PFV      Full                                                        +---------+---------------+---------+-----------+----------+--------------+ POP      Full           Yes      Yes                                 +---------+---------------+---------+-----------+----------+--------------+ PTV      Partial                                      Chronic         +---------+---------------+---------+-----------+----------+--------------+ PERO     Full                                                        +---------+---------------+---------+-----------+----------+--------------+     Summary: RIGHT: - No evidence of common femoral vein obstruction.  LEFT: - Findings consistent with chronic deep vein thrombosis involving the left posterior tibial veins. - No cystic structure found in the popliteal fossa.  *See table(s) above for measurements and observations. Electronically signed by Monica Martinez MD on 04/04/2020 at 3:38:44 PM.    Final     ASSESSMENT & PLAN:   79 yo with   1) ?Unprovoked b/l lower extremity DVT  PLAN: -Discussed pt labwork, 04/04/20; blood counts are nml, blood chemistries are stable, D-dimer is WNL -Discussed 04/04/2020 [place] Lower Extremity Venous (DVT) ([id number]) which revealed "RIGHT: - No evidence of common femoral vein obstruction. LEFT: - Findings consistent with chronic deep vein thrombosis involving the left posterior tibial veins. - No cystic structure found in the popliteal fossa." -Advised pt that her chronic DVT in the left posterior tibial veins will likely never completely dissolve, but is not worrisome at his time. -Advised pt that normal D-dimer suggest no current clot formation on a chemical level.  -Would not be unreasonable to switch from Eliquis to Aspirin at this time - pt agrees.  -Recommend pt take 81 mg enteric coated baby ASA daily  -Recommend pt contact our clinic or Dr. Jacelyn Grip if she begins to experience leg pain or leg swelling.  -If there is any evidence of clot progression/new clot she will need life long anticoagulation. -RTC with  Dr Irene Limbo as needed  The total time spent in the appt was 20 minutes and more than 50% was on counseling and direct patient cares.  All of the patient's questions were answered with apparent satisfaction. The patient knows to call the clinic with any problems, questions or  concerns.    Sullivan Lone MD [place] AAHIVMS [facility] Hematology/Oncology Physician [facility]  (Office):       [phone] (Work cell):  [phone] (Fax):           [phone]  05/03/2020 4:40 PM  I, Yevette Edwards, am acting as a scribe for Dr. Sullivan Lone.   .I have reviewed the above documentation for accuracy and completeness, and I agree with the above. Brunetta Genera MD

## 2020-05-18 DIAGNOSIS — E7800 Pure hypercholesterolemia, unspecified: Secondary | ICD-10-CM | POA: Diagnosis not present

## 2020-05-18 DIAGNOSIS — I10 Essential (primary) hypertension: Secondary | ICD-10-CM | POA: Diagnosis not present

## 2020-05-18 DIAGNOSIS — F4321 Adjustment disorder with depressed mood: Secondary | ICD-10-CM | POA: Diagnosis not present

## 2020-06-17 DIAGNOSIS — I10 Essential (primary) hypertension: Secondary | ICD-10-CM | POA: Diagnosis not present

## 2020-06-17 DIAGNOSIS — E7800 Pure hypercholesterolemia, unspecified: Secondary | ICD-10-CM | POA: Diagnosis not present

## 2020-06-17 DIAGNOSIS — F4321 Adjustment disorder with depressed mood: Secondary | ICD-10-CM | POA: Diagnosis not present

## 2020-07-11 DIAGNOSIS — I10 Essential (primary) hypertension: Secondary | ICD-10-CM | POA: Diagnosis not present

## 2020-07-11 DIAGNOSIS — K219 Gastro-esophageal reflux disease without esophagitis: Secondary | ICD-10-CM | POA: Diagnosis not present

## 2020-07-11 DIAGNOSIS — F4321 Adjustment disorder with depressed mood: Secondary | ICD-10-CM | POA: Diagnosis not present

## 2020-07-11 DIAGNOSIS — E7800 Pure hypercholesterolemia, unspecified: Secondary | ICD-10-CM | POA: Diagnosis not present

## 2020-08-10 DIAGNOSIS — E7800 Pure hypercholesterolemia, unspecified: Secondary | ICD-10-CM | POA: Diagnosis not present

## 2020-08-10 DIAGNOSIS — F4321 Adjustment disorder with depressed mood: Secondary | ICD-10-CM | POA: Diagnosis not present

## 2020-08-10 DIAGNOSIS — I10 Essential (primary) hypertension: Secondary | ICD-10-CM | POA: Diagnosis not present

## 2020-08-10 DIAGNOSIS — K219 Gastro-esophageal reflux disease without esophagitis: Secondary | ICD-10-CM | POA: Diagnosis not present

## 2020-09-15 DIAGNOSIS — I10 Essential (primary) hypertension: Secondary | ICD-10-CM | POA: Diagnosis not present

## 2020-09-15 DIAGNOSIS — E7800 Pure hypercholesterolemia, unspecified: Secondary | ICD-10-CM | POA: Diagnosis not present

## 2020-09-15 DIAGNOSIS — K219 Gastro-esophageal reflux disease without esophagitis: Secondary | ICD-10-CM | POA: Diagnosis not present

## 2020-09-15 DIAGNOSIS — F4321 Adjustment disorder with depressed mood: Secondary | ICD-10-CM | POA: Diagnosis not present

## 2020-09-21 DIAGNOSIS — I10 Essential (primary) hypertension: Secondary | ICD-10-CM | POA: Diagnosis not present

## 2020-09-21 DIAGNOSIS — K219 Gastro-esophageal reflux disease without esophagitis: Secondary | ICD-10-CM | POA: Diagnosis not present

## 2020-09-21 DIAGNOSIS — F4321 Adjustment disorder with depressed mood: Secondary | ICD-10-CM | POA: Diagnosis not present

## 2020-09-21 DIAGNOSIS — E7800 Pure hypercholesterolemia, unspecified: Secondary | ICD-10-CM | POA: Diagnosis not present

## 2020-10-05 DIAGNOSIS — I10 Essential (primary) hypertension: Secondary | ICD-10-CM | POA: Diagnosis not present

## 2020-10-05 DIAGNOSIS — I82441 Acute embolism and thrombosis of right tibial vein: Secondary | ICD-10-CM | POA: Diagnosis not present

## 2020-10-05 DIAGNOSIS — Z91040 Latex allergy status: Secondary | ICD-10-CM | POA: Diagnosis not present

## 2020-10-05 DIAGNOSIS — Z880 Allergy status to penicillin: Secondary | ICD-10-CM | POA: Diagnosis not present

## 2020-10-05 DIAGNOSIS — Z882 Allergy status to sulfonamides status: Secondary | ICD-10-CM | POA: Diagnosis not present

## 2020-10-05 DIAGNOSIS — Z86718 Personal history of other venous thrombosis and embolism: Secondary | ICD-10-CM | POA: Diagnosis not present

## 2020-10-05 DIAGNOSIS — M79661 Pain in right lower leg: Secondary | ICD-10-CM | POA: Diagnosis not present

## 2020-10-06 DIAGNOSIS — E7800 Pure hypercholesterolemia, unspecified: Secondary | ICD-10-CM | POA: Diagnosis not present

## 2020-10-06 DIAGNOSIS — I824Z1 Acute embolism and thrombosis of unspecified deep veins of right distal lower extremity: Secondary | ICD-10-CM | POA: Diagnosis not present

## 2020-10-06 DIAGNOSIS — I10 Essential (primary) hypertension: Secondary | ICD-10-CM | POA: Diagnosis not present

## 2020-10-20 ENCOUNTER — Other Ambulatory Visit: Payer: PPO

## 2020-10-27 DIAGNOSIS — F439 Reaction to severe stress, unspecified: Secondary | ICD-10-CM | POA: Diagnosis not present

## 2020-10-27 DIAGNOSIS — E7800 Pure hypercholesterolemia, unspecified: Secondary | ICD-10-CM | POA: Diagnosis not present

## 2020-10-27 DIAGNOSIS — I824Z1 Acute embolism and thrombosis of unspecified deep veins of right distal lower extremity: Secondary | ICD-10-CM | POA: Diagnosis not present

## 2020-10-27 DIAGNOSIS — I10 Essential (primary) hypertension: Secondary | ICD-10-CM | POA: Diagnosis not present

## 2020-11-07 ENCOUNTER — Other Ambulatory Visit (HOSPITAL_COMMUNITY): Payer: Self-pay | Admitting: Family Medicine

## 2020-11-07 DIAGNOSIS — Z86718 Personal history of other venous thrombosis and embolism: Secondary | ICD-10-CM

## 2020-11-07 DIAGNOSIS — I10 Essential (primary) hypertension: Secondary | ICD-10-CM | POA: Diagnosis not present

## 2020-11-07 DIAGNOSIS — F439 Reaction to severe stress, unspecified: Secondary | ICD-10-CM | POA: Diagnosis not present

## 2020-11-07 DIAGNOSIS — M79604 Pain in right leg: Secondary | ICD-10-CM

## 2020-11-07 DIAGNOSIS — E7800 Pure hypercholesterolemia, unspecified: Secondary | ICD-10-CM | POA: Diagnosis not present

## 2020-11-07 DIAGNOSIS — I824Z1 Acute embolism and thrombosis of unspecified deep veins of right distal lower extremity: Secondary | ICD-10-CM | POA: Diagnosis not present

## 2020-11-08 ENCOUNTER — Other Ambulatory Visit: Payer: Self-pay

## 2020-11-08 ENCOUNTER — Ambulatory Visit (HOSPITAL_COMMUNITY)
Admission: RE | Admit: 2020-11-08 | Discharge: 2020-11-08 | Disposition: A | Discharge status: Home / Self Care | Payer: PPO | Source: Ambulatory Visit | Attending: Family Medicine | Admitting: Family Medicine

## 2020-11-08 DIAGNOSIS — Z86718 Personal history of other venous thrombosis and embolism: Secondary | ICD-10-CM

## 2020-11-08 DIAGNOSIS — M79604 Pain in right leg: Secondary | ICD-10-CM | POA: Diagnosis not present

## 2020-11-08 NOTE — CV Procedure (Signed)
RLE venous duplex completed. Attempted to call preliminary results to Dr. Jacelyn Grip, voicemail left with Pamala Hurry at [phone]  Results can be found under chart review under CV PROC. 11/08/2020 9:35 AM Raffaella Edison RVT, RDMS

## 2020-11-14 DIAGNOSIS — F4321 Adjustment disorder with depressed mood: Secondary | ICD-10-CM | POA: Diagnosis not present

## 2020-11-14 DIAGNOSIS — E7800 Pure hypercholesterolemia, unspecified: Secondary | ICD-10-CM | POA: Diagnosis not present

## 2020-11-14 DIAGNOSIS — K219 Gastro-esophageal reflux disease without esophagitis: Secondary | ICD-10-CM | POA: Diagnosis not present

## 2020-11-14 DIAGNOSIS — I10 Essential (primary) hypertension: Secondary | ICD-10-CM | POA: Diagnosis not present

## 2020-11-16 ENCOUNTER — Other Ambulatory Visit: Payer: PPO

## 2020-11-22 ENCOUNTER — Other Ambulatory Visit: Payer: Self-pay

## 2020-11-22 ENCOUNTER — Other Ambulatory Visit: Payer: PPO

## 2020-11-22 ENCOUNTER — Ambulatory Visit
Admission: RE | Admit: 2020-11-22 | Discharge: 2020-11-22 | Disposition: A | Discharge status: Home / Self Care | Payer: PPO | Source: Ambulatory Visit | Attending: Critical Care Medicine | Admitting: Critical Care Medicine

## 2020-11-22 DIAGNOSIS — J984 Other disorders of lung: Secondary | ICD-10-CM | POA: Diagnosis not present

## 2020-11-22 DIAGNOSIS — R918 Other nonspecific abnormal finding of lung field: Secondary | ICD-10-CM

## 2020-11-22 DIAGNOSIS — I2510 Atherosclerotic heart disease of native coronary artery without angina pectoris: Secondary | ICD-10-CM | POA: Diagnosis not present

## 2020-11-22 DIAGNOSIS — I708 Atherosclerosis of other arteries: Secondary | ICD-10-CM | POA: Diagnosis not present

## 2020-11-22 DIAGNOSIS — M419 Scoliosis, unspecified: Secondary | ICD-10-CM | POA: Diagnosis not present

## 2020-12-06 DIAGNOSIS — F4321 Adjustment disorder with depressed mood: Secondary | ICD-10-CM | POA: Diagnosis not present

## 2020-12-06 DIAGNOSIS — K219 Gastro-esophageal reflux disease without esophagitis: Secondary | ICD-10-CM | POA: Diagnosis not present

## 2020-12-06 DIAGNOSIS — I10 Essential (primary) hypertension: Secondary | ICD-10-CM | POA: Diagnosis not present

## 2020-12-06 DIAGNOSIS — E7800 Pure hypercholesterolemia, unspecified: Secondary | ICD-10-CM | POA: Diagnosis not present

## 2020-12-27 DIAGNOSIS — I10 Essential (primary) hypertension: Secondary | ICD-10-CM | POA: Diagnosis not present

## 2020-12-27 DIAGNOSIS — I82403 Acute embolism and thrombosis of unspecified deep veins of lower extremity, bilateral: Secondary | ICD-10-CM | POA: Diagnosis not present

## 2020-12-27 DIAGNOSIS — Z7901 Long term (current) use of anticoagulants: Secondary | ICD-10-CM | POA: Diagnosis not present

## 2020-12-27 DIAGNOSIS — Z86018 Personal history of other benign neoplasm: Secondary | ICD-10-CM | POA: Diagnosis not present

## 2020-12-27 DIAGNOSIS — F439 Reaction to severe stress, unspecified: Secondary | ICD-10-CM | POA: Diagnosis not present

## 2020-12-27 DIAGNOSIS — M4807 Spinal stenosis, lumbosacral region: Secondary | ICD-10-CM | POA: Diagnosis not present

## 2020-12-27 DIAGNOSIS — G43909 Migraine, unspecified, not intractable, without status migrainosus: Secondary | ICD-10-CM | POA: Diagnosis not present

## 2021-01-31 ENCOUNTER — Other Ambulatory Visit: Payer: Self-pay

## 2021-01-31 ENCOUNTER — Ambulatory Visit: Payer: PPO | Admitting: Internal Medicine

## 2021-01-31 ENCOUNTER — Encounter: Payer: Self-pay | Admitting: Internal Medicine

## 2021-01-31 VITALS — BP 108/64 | HR 55 | Temp 97.7°F | Ht 68.5 in | Wt 164.8 lb

## 2021-01-31 DIAGNOSIS — A319 Mycobacterial infection, unspecified: Secondary | ICD-10-CM

## 2021-01-31 DIAGNOSIS — R9389 Abnormal findings on diagnostic imaging of other specified body structures: Secondary | ICD-10-CM | POA: Diagnosis not present

## 2021-01-31 NOTE — Progress Notes (Signed)
Denise Hawkins    [id number]    22-May-1941  Primary Care Physician:Wong, Edwyna Shell, MD Date of Appointment: 01/31/2021 Established Patient Visit  Chief complaint:   Chief Complaint  Patient presents with   Follow-up    Review CT results     HPI: Denise Hawkins is a 80 y.o. woman with history of GERD and abnormal CT Chest.  Interval Updates: Former patient of Dr. Carlis Abbott, here to establish care with me today. Here with her daughter Jacqlyn Larsen.  Abdominal symptoms of reflux and bloating are controlled on PPI.  No cough, chest pain, shortness of breath, fevers, chills, night sweats, weight loss.   Passive smoke exposure in childhood and in adulthood.   I have reviewed the patient's family social and past medical history and updated as appropriate.   Past Medical History:  Diagnosis Date   Anxiety    Arthritis    Gastritis    GERD (gastroesophageal reflux disease)    GERD (gastroesophageal reflux disease)    Headache(784.0)    migraines   Hypertension    PONV (postoperative nausea and vomiting)     Past Surgical History:  Procedure Laterality Date   APPENDECTOMY     BACK SURGERY     BREAST SURGERY     right biopsy-benign   COLONOSCOPY     DILATION AND CURETTAGE OF UTERUS     x4   ESOPHAGOGASTRODUODENOSCOPY     EYE SURGERY     left with lens implant   SUBMANDIBULAR GLAND EXCISION Right 11/20/2017   Procedure: EXCISION  OF RIGHT SUBMANDIBULAR GLAND;  Surgeon: Jerrell Belfast, MD;  Location: [place];  Service: ENT;  Laterality: Right;   TONSILLECTOMY     TOTAL HIP ARTHROPLASTY Right 07/07/2013   Procedure: RIGHT TOTAL HIP ARTHROPLASTY ANTERIOR APPROACH;  Surgeon: Mauri Pole, MD;  Location: [facility];  Service: Orthopedics;  Laterality: Right;    Family History  Problem Relation Age of Onset   Heart disease Father     Social History   Occupational History   Not on file  Tobacco Use   Smoking status: Never   Smokeless tobacco: Never  Vaping Use    Vaping Use: Never used  Substance and Sexual Activity   Alcohol use: No   Drug use: No   Sexual activity: Not on file     Physical Exam: Blood pressure 108/64, pulse (!) 55, temperature 97.7 F (36.5 C), temperature source Temporal, height 5' 8.5" (1.74 m), weight 164 lb 12.8 oz (74.8 kg), SpO2 97 %.  Gen:      No acute distress ENT:  no nasal polyps, mucus membranes moist Lungs:    No increased respiratory effort, symmetric chest wall excursion, clear to auscultation bilaterally, no wheezes or crackles CV:         Regular rate and rhythm; no murmurs, rubs, or gallops.  Bilateral venous stasis with edema   Data Reviewed: Imaging: I have personally reviewed the CT Chest from 2021 and compared it to the one done in April 2022.  There is mild progression of nodular densities in the right middle lobe with some areas of mucous plugging.  This is most consistent with atypical mycobacterial infection.  No lymphadenopathy no effusion.  PFTs: No flowsheet data found.   Labs:  Immunization status: Immunization History  Administered Date(s) Administered   Fluad Quad(high Dose 65+) 07/08/2019   Influenza Split 07/20/2017, 06/16/2018   Moderna Sars-Covid-2 Vaccination 01/16/2020  Assessment:  Abnormal CT Chest Atypical Mycobacterial Infection  Plan/Recommendations: Mrs. Stool has persistent abnormality in the right middle lobe consistent with atypical infection, likely mycobacterial disease.  She is asymptomatic without any respiratory symptoms.  We discussed the pros and cons of treatment of mycobacterial disease and she has elected not to pursue this further right now based on her asymptomatic nature.  I agree with this and support this.  We have decided to monitor with a repeat CT scan in 1 years time to see if she has any progression.  In the meantime she is been cautioned on worsening symptoms including fevers chills night sweats weight loss shortness of breath chest tightness  cough.    Return to Care: Return in about 1 year (around 01/31/2022).   [name], MD [facility] [place]

## 2021-01-31 NOTE — Patient Instructions (Signed)
The patient should have follow up scheduled with myself in 12 months.   Prior to next visit patient should have: CT Chest

## 2021-03-10 DIAGNOSIS — K219 Gastro-esophageal reflux disease without esophagitis: Secondary | ICD-10-CM | POA: Diagnosis not present

## 2021-03-10 DIAGNOSIS — F4321 Adjustment disorder with depressed mood: Secondary | ICD-10-CM | POA: Diagnosis not present

## 2021-03-10 DIAGNOSIS — E7800 Pure hypercholesterolemia, unspecified: Secondary | ICD-10-CM | POA: Diagnosis not present

## 2021-03-10 DIAGNOSIS — I10 Essential (primary) hypertension: Secondary | ICD-10-CM | POA: Diagnosis not present

## 2021-03-28 DIAGNOSIS — E7800 Pure hypercholesterolemia, unspecified: Secondary | ICD-10-CM | POA: Diagnosis not present

## 2021-03-28 DIAGNOSIS — K219 Gastro-esophageal reflux disease without esophagitis: Secondary | ICD-10-CM | POA: Diagnosis not present

## 2021-03-28 DIAGNOSIS — F4321 Adjustment disorder with depressed mood: Secondary | ICD-10-CM | POA: Diagnosis not present

## 2021-03-28 DIAGNOSIS — I824Z3 Acute embolism and thrombosis of unspecified deep veins of distal lower extremity, bilateral: Secondary | ICD-10-CM | POA: Diagnosis not present

## 2021-03-28 DIAGNOSIS — I10 Essential (primary) hypertension: Secondary | ICD-10-CM | POA: Diagnosis not present

## 2021-04-25 DIAGNOSIS — K219 Gastro-esophageal reflux disease without esophagitis: Secondary | ICD-10-CM | POA: Diagnosis not present

## 2021-04-25 DIAGNOSIS — F4321 Adjustment disorder with depressed mood: Secondary | ICD-10-CM | POA: Diagnosis not present

## 2021-04-25 DIAGNOSIS — I10 Essential (primary) hypertension: Secondary | ICD-10-CM | POA: Diagnosis not present

## 2021-04-25 DIAGNOSIS — E7800 Pure hypercholesterolemia, unspecified: Secondary | ICD-10-CM | POA: Diagnosis not present

## 2021-05-11 DIAGNOSIS — Z1389 Encounter for screening for other disorder: Secondary | ICD-10-CM | POA: Diagnosis not present

## 2021-05-11 DIAGNOSIS — Z0000 Encounter for general adult medical examination without abnormal findings: Secondary | ICD-10-CM | POA: Diagnosis not present

## 2021-06-07 DIAGNOSIS — E7800 Pure hypercholesterolemia, unspecified: Secondary | ICD-10-CM | POA: Diagnosis not present

## 2021-06-07 DIAGNOSIS — I10 Essential (primary) hypertension: Secondary | ICD-10-CM | POA: Diagnosis not present

## 2021-06-07 DIAGNOSIS — F4321 Adjustment disorder with depressed mood: Secondary | ICD-10-CM | POA: Diagnosis not present

## 2021-06-07 DIAGNOSIS — K219 Gastro-esophageal reflux disease without esophagitis: Secondary | ICD-10-CM | POA: Diagnosis not present

## 2021-07-11 DIAGNOSIS — K219 Gastro-esophageal reflux disease without esophagitis: Secondary | ICD-10-CM | POA: Diagnosis not present

## 2021-07-11 DIAGNOSIS — F4321 Adjustment disorder with depressed mood: Secondary | ICD-10-CM | POA: Diagnosis not present

## 2021-07-11 DIAGNOSIS — E7800 Pure hypercholesterolemia, unspecified: Secondary | ICD-10-CM | POA: Diagnosis not present

## 2021-07-11 DIAGNOSIS — I10 Essential (primary) hypertension: Secondary | ICD-10-CM | POA: Diagnosis not present

## 2021-07-25 ENCOUNTER — Other Ambulatory Visit: Payer: Self-pay | Admitting: Family Medicine

## 2021-07-25 DIAGNOSIS — I10 Essential (primary) hypertension: Secondary | ICD-10-CM | POA: Diagnosis not present

## 2021-07-25 DIAGNOSIS — Z23 Encounter for immunization: Secondary | ICD-10-CM | POA: Diagnosis not present

## 2021-07-25 DIAGNOSIS — R4182 Altered mental status, unspecified: Secondary | ICD-10-CM

## 2021-07-25 DIAGNOSIS — K2100 Gastro-esophageal reflux disease with esophagitis, without bleeding: Secondary | ICD-10-CM | POA: Diagnosis not present

## 2021-07-25 DIAGNOSIS — Z86018 Personal history of other benign neoplasm: Secondary | ICD-10-CM | POA: Diagnosis not present

## 2021-07-25 DIAGNOSIS — Z7901 Long term (current) use of anticoagulants: Secondary | ICD-10-CM | POA: Diagnosis not present

## 2021-07-25 DIAGNOSIS — M4807 Spinal stenosis, lumbosacral region: Secondary | ICD-10-CM | POA: Diagnosis not present

## 2021-07-25 DIAGNOSIS — G43909 Migraine, unspecified, not intractable, without status migrainosus: Secondary | ICD-10-CM | POA: Diagnosis not present

## 2021-08-01 ENCOUNTER — Ambulatory Visit
Admission: RE | Admit: 2021-08-01 | Discharge: 2021-08-01 | Disposition: A | Discharge status: Home / Self Care | Payer: PPO | Source: Ambulatory Visit | Attending: Family Medicine | Admitting: Family Medicine

## 2021-08-01 ENCOUNTER — Other Ambulatory Visit: Payer: PPO

## 2021-08-01 DIAGNOSIS — R4182 Altered mental status, unspecified: Secondary | ICD-10-CM | POA: Diagnosis not present

## 2021-08-01 DIAGNOSIS — I6523 Occlusion and stenosis of bilateral carotid arteries: Secondary | ICD-10-CM | POA: Diagnosis not present

## 2021-10-25 DIAGNOSIS — H903 Sensorineural hearing loss, bilateral: Secondary | ICD-10-CM | POA: Diagnosis not present

## 2021-10-25 DIAGNOSIS — H9313 Tinnitus, bilateral: Secondary | ICD-10-CM | POA: Diagnosis not present

## 2021-11-08 IMAGING — CT CT ABD-PELV W/ CM
1 of 3 series · 12 of 32 positions shown, 18 images · IV contrast (APPLIED)
Comparison: None.

CLINICAL DATA: Abdominal pain and bloating for 4 weeks.

EXAM:
CT ABDOMEN AND PELVIS WITH CONTRAST
TECHNIQUE: Multidetector CT imaging of the abdomen and pelvis was performed
using the standard protocol following bolus administration of
intravenous contrast.
CONTRAST:  100mL [id number] IOPAMIDOL ([id number]-SPP) INJECTION 61%

[Series 2: abd/pelvis w/cm · axial · 0.76mm/px · z∈[-371,-11]mm · 12 of 85 slices shown, 18 images]
[im 7/85  soft-tissue]
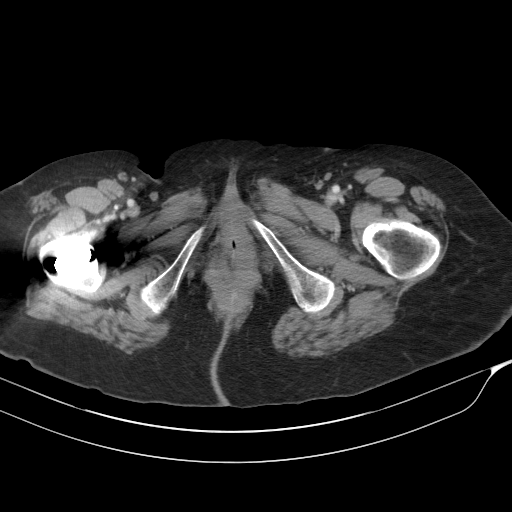
[im 7/85  bone]
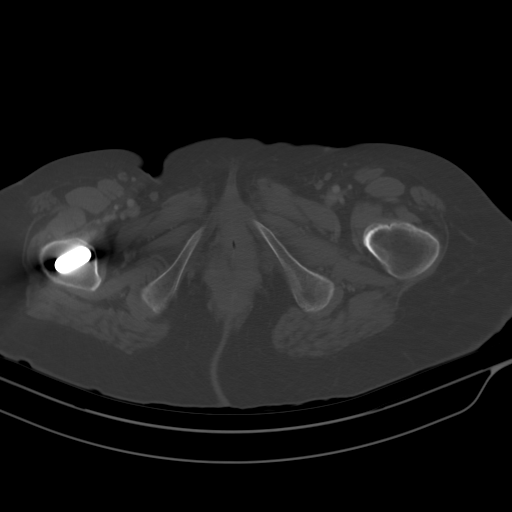
[im 13/85  soft-tissue]
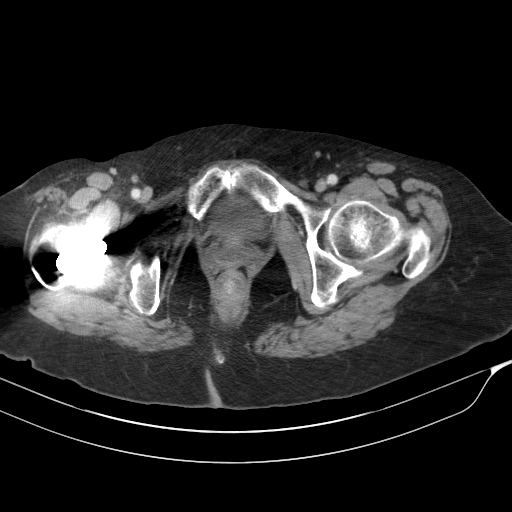
[im 19/85  soft-tissue]
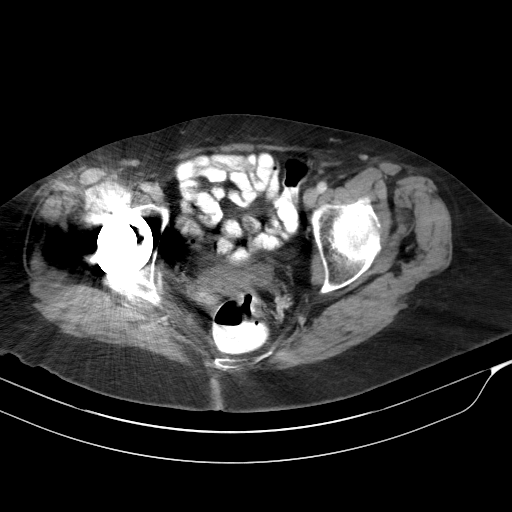
[im 25/85  soft-tissue]
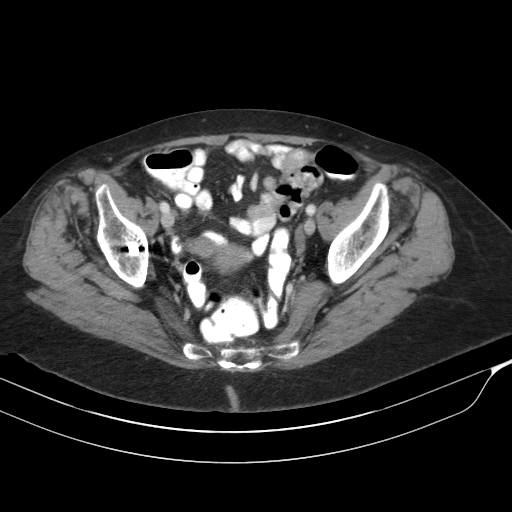
[im 31/85  soft-tissue]
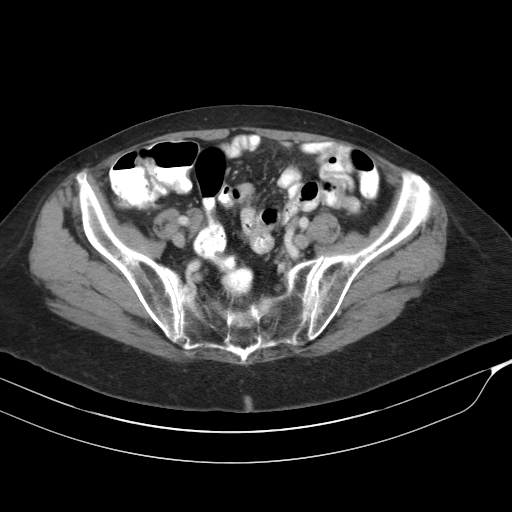
[im 37/85  soft-tissue]
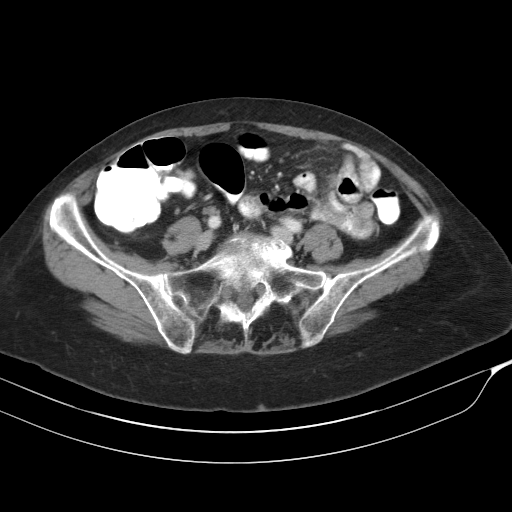
[im 49/85  soft-tissue]
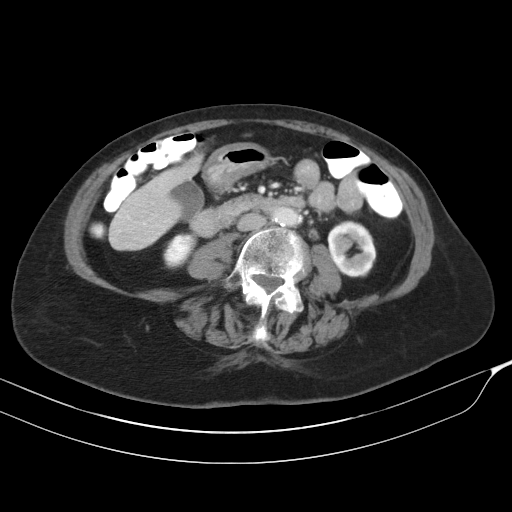
[im 55/85  soft-tissue]
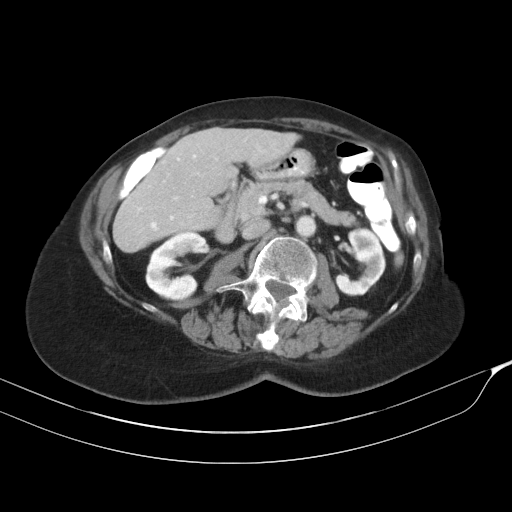
[im 61/85  soft-tissue]
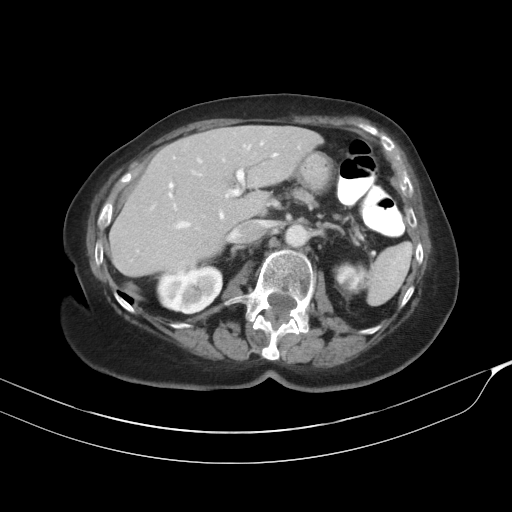
[im 61/85  lung]
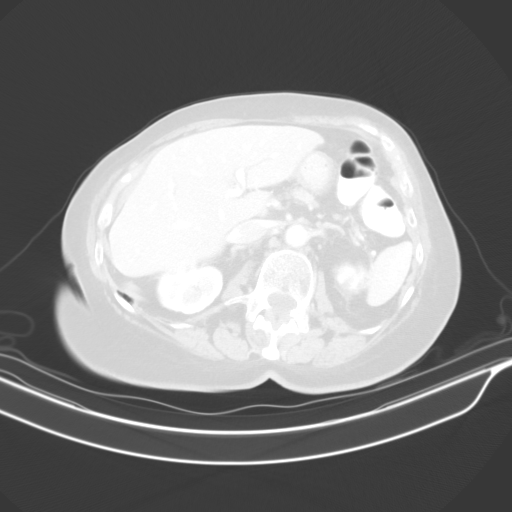
[im 61/85  bone]
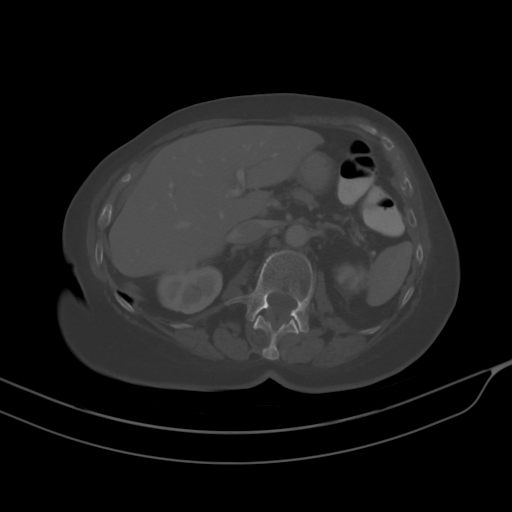
[im 67/85  soft-tissue]
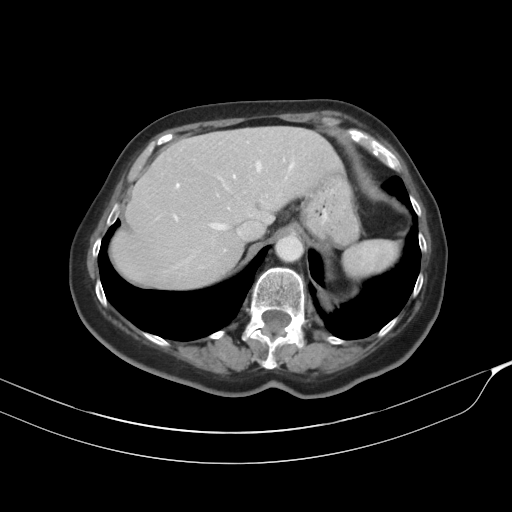
[im 67/85  lung]
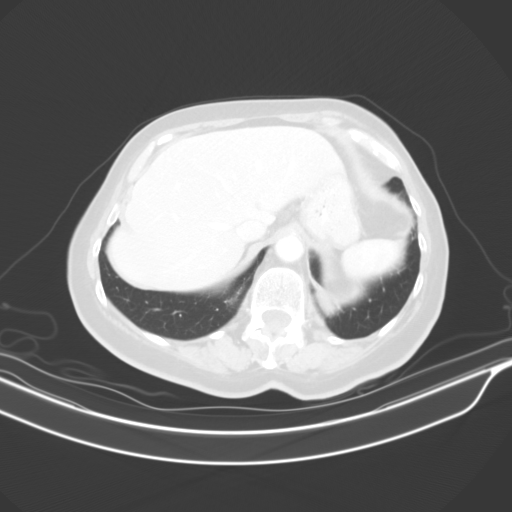
[im 73/85  soft-tissue]
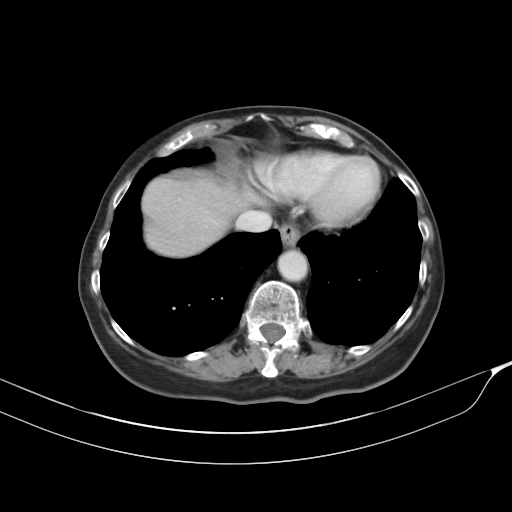
[im 73/85  lung]
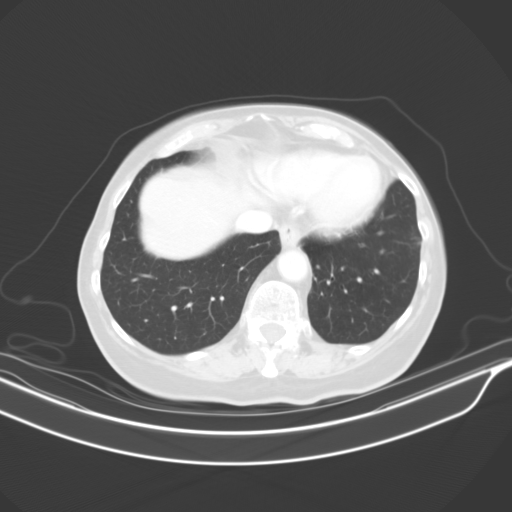
[im 79/85  soft-tissue]
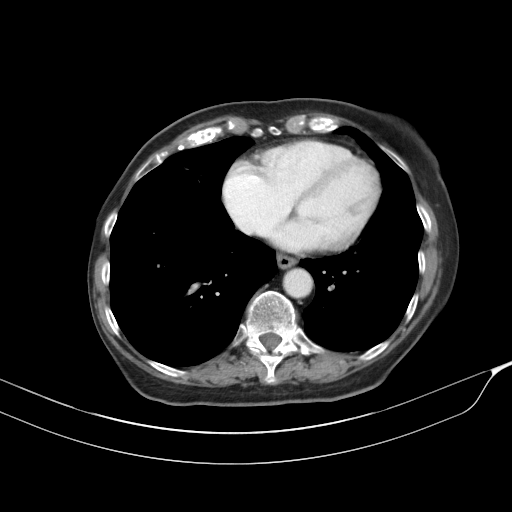
[im 79/85  lung]
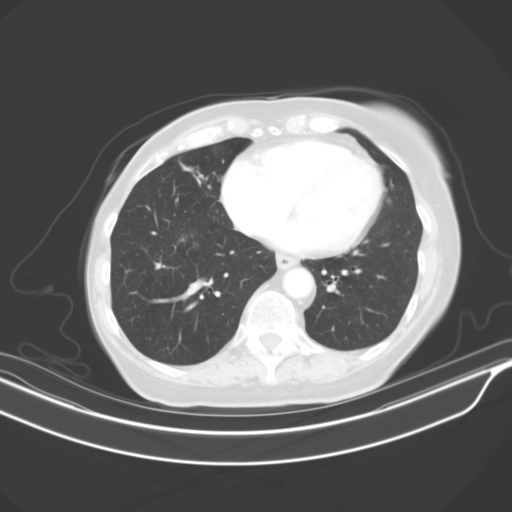

[12 of 32 positions shown; findings below may reference images not displayed]

FINDINGS: Lower chest: Patchy areas of tree-in-bud type changes in the right
middle lobe and both lower lobes suggesting chronic inflammation or
possibly atypical infection such as ALICIA VICTORIA. No worrisome pulmonary
lesions. The heart is normal in size. No pericardial effusion.

Hepatobiliary: No focal hepatic lesions or intrahepatic biliary
dilatation. The gallbladder appears normal. No common bile duct
dilatation.

Pancreas: No mass, inflammation or ductal dilatation.

Spleen: Normal size.  No focal lesions.

Adrenals/Urinary Tract: The adrenal glands are normal.

Mild renal cortical scarring type changes and a few small renal
cysts but no worrisome renal lesions or hydronephrosis. The bladder
is grossly normal.

Stomach/Bowel: The stomach, duodenum, small bowel and colon are
unremarkable. No acute inflammatory changes, mass lesions or
obstructive findings. The terminal ileum is normal. The appendix is
surgically absent.

Vascular/Lymphatic: The aorta is normal in caliber. No dissection.
Scattered atherosclerotic calcifications and moderate tortuosity.
The branch vessels are patent. The major venous structures are
patent. No mesenteric or retroperitoneal mass or adenopathy. Small
scattered lymph nodes are noted.

Reproductive: The uterus and ovaries are normal.

Other: No pelvic mass or adenopathy. No free pelvic fluid
collections. No inguinal mass or adenopathy. There is a small
periumbilical abdominal wall hernia containing fat.

Musculoskeletal: No significant bony findings. There is moderate
left convex lumbar scoliosis and associated advanced degenerative
lumbar spondylosis with multilevel disc disease and facet disease.

Total right hip arthroplasty without complicating features. Moderate
associated artifact.
IMPRESSION: 1. No acute abdominal/pelvic findings, mass lesions or adenopathy.
2. Chronic scarring appearing changes involving both kidneys, left
greater than right but no worrisome renal lesions or hydronephrosis.
3. Mild tree-in-bud type changes at the lung bases could suggest
chronic inflammation or atypical infection such as ALICIA VICTORIA.

## 2021-11-22 ENCOUNTER — Other Ambulatory Visit: Payer: PPO

## 2021-12-08 DIAGNOSIS — E7800 Pure hypercholesterolemia, unspecified: Secondary | ICD-10-CM | POA: Diagnosis not present

## 2021-12-08 DIAGNOSIS — F4322 Adjustment disorder with anxiety: Secondary | ICD-10-CM | POA: Diagnosis not present

## 2021-12-08 DIAGNOSIS — G43909 Migraine, unspecified, not intractable, without status migrainosus: Secondary | ICD-10-CM | POA: Diagnosis not present

## 2021-12-08 DIAGNOSIS — I10 Essential (primary) hypertension: Secondary | ICD-10-CM | POA: Diagnosis not present

## 2021-12-14 DIAGNOSIS — I10 Essential (primary) hypertension: Secondary | ICD-10-CM | POA: Diagnosis not present

## 2021-12-14 DIAGNOSIS — E7800 Pure hypercholesterolemia, unspecified: Secondary | ICD-10-CM | POA: Diagnosis not present

## 2021-12-14 DIAGNOSIS — K219 Gastro-esophageal reflux disease without esophagitis: Secondary | ICD-10-CM | POA: Diagnosis not present

## 2021-12-14 DIAGNOSIS — F4321 Adjustment disorder with depressed mood: Secondary | ICD-10-CM | POA: Diagnosis not present

## 2021-12-21 ENCOUNTER — Ambulatory Visit
Admission: RE | Admit: 2021-12-21 | Discharge: 2021-12-21 | Disposition: A | Discharge status: Home / Self Care | Payer: PPO | Source: Ambulatory Visit | Attending: Internal Medicine | Admitting: Internal Medicine

## 2021-12-21 DIAGNOSIS — J479 Bronchiectasis, uncomplicated: Secondary | ICD-10-CM | POA: Diagnosis not present

## 2021-12-21 DIAGNOSIS — I700 Atherosclerosis of aorta: Secondary | ICD-10-CM | POA: Diagnosis not present

## 2021-12-21 DIAGNOSIS — A319 Mycobacterial infection, unspecified: Secondary | ICD-10-CM

## 2021-12-21 DIAGNOSIS — R9389 Abnormal findings on diagnostic imaging of other specified body structures: Secondary | ICD-10-CM

## 2021-12-26 NOTE — Progress Notes (Signed)
Patient is scheduled on 01/19/2022 at 1:45pm- nothing further needed.  ?

## 2022-01-19 ENCOUNTER — Ambulatory Visit: Payer: PPO | Admitting: Internal Medicine

## 2022-01-19 ENCOUNTER — Encounter: Payer: Self-pay | Admitting: Internal Medicine

## 2022-01-19 VITALS — BP 110/64 | HR 60 | Temp 98.0°F | Ht 68.5 in | Wt 161.0 lb

## 2022-01-19 DIAGNOSIS — A310 Pulmonary mycobacterial infection: Secondary | ICD-10-CM

## 2022-01-19 DIAGNOSIS — R918 Other nonspecific abnormal finding of lung field: Secondary | ICD-10-CM | POA: Diagnosis not present

## 2022-01-19 NOTE — Patient Instructions (Signed)
Please schedule follow up scheduled with myself in 12 months.  If my schedule is not open yet, we will contact you with a reminder closer to that time. Please call [phone] if you haven't heard from [place] a month before.   I will see you in a year!  No need for more scans unless there is a change in your symptoms   Let me know if you have any problems with your breathing before this.

## 2022-01-19 NOTE — Progress Notes (Signed)
Denise Hawkins    [id number]    04-18-1941  Primary Care Physician:Wong, Edwyna Shell, MD Date of Appointment: 01/19/2022 Established Patient Visit  Chief complaint:   Chief Complaint  Patient presents with   Follow-up    She is doing well today and here to go over a CT scan.      HPI: Denise Hawkins is a 81 y.o. woman with history of GERD and abnormal CT Chest and suspected MAI infection.   Interval Updates: Here for one year follow up. Had a CT Chest in May 2023 to follow up one year on pulmonary nodules.   No interval symptoms of nausea, weight loss, fevers, chills.   Passive smoke exposure in childhood and in adulthood.   She has been having some spells where she spaces out and starts crying. Feels these symptoms started after her last covid shot. Denies syncope, palpitation. Not having any headaches or focal weakness.    I have reviewed the patient's family social and past medical history and updated as appropriate.   Past Medical History:  Diagnosis Date   Anxiety    Arthritis    Gastritis    GERD (gastroesophageal reflux disease)    GERD (gastroesophageal reflux disease)    Headache(784.0)    migraines   Hypertension    PONV (postoperative nausea and vomiting)     Past Surgical History:  Procedure Laterality Date   APPENDECTOMY     BACK SURGERY     BREAST SURGERY     right biopsy-benign   COLONOSCOPY     DILATION AND CURETTAGE OF UTERUS     x4   ESOPHAGOGASTRODUODENOSCOPY     EYE SURGERY     left with lens implant   SUBMANDIBULAR GLAND EXCISION Right 11/20/2017   Procedure: EXCISION  OF RIGHT SUBMANDIBULAR GLAND;  Surgeon: Jerrell Belfast, MD;  Location: [facility];  Service: ENT;  Laterality: Right;   TONSILLECTOMY     TOTAL HIP ARTHROPLASTY Right 07/07/2013   Procedure: RIGHT TOTAL HIP ARTHROPLASTY ANTERIOR APPROACH;  Surgeon: Mauri Pole, MD;  Location: [facility];  Service: Orthopedics;  Laterality: Right;    Family History  Problem  Relation Age of Onset   Heart disease Father     Social History   Occupational History   Not on file  Tobacco Use   Smoking status: Never   Smokeless tobacco: Never  Vaping Use   Vaping Use: Never used  Substance and Sexual Activity   Alcohol use: No   Drug use: No   Sexual activity: Not on file     Physical Exam: Blood pressure 110/64, pulse 60, temperature 98 F (36.7 C), temperature source Oral, height 5' 8.5" (1.74 m), weight 161 lb (73 kg), SpO2 97 %.  Gen:      No acute distress Lungs:    Ctab no wheezes or crackles CV:         RRr no mrg   Data Reviewed: Imaging: CT Chest 2023 with stable RML nodules consistent with atypical myobacterial infection. Not substantially changed since 2022.   PFTs:      View : No data to display.           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  She is clinically asymptomatic. NO further imaging needed unless there is a substantial changes in her symptoms. She would likely not undergo treatment with multi-drug therapy even if she did have biopsy proven disease, but would consider airway clearance or inhaler therapy.  I will see her back in a year to monitor clinical symptoms.    Return to Care: Return in about 1 year (around 01/20/2023).   [name], MD Pulmonary [facility] [place]

## 2022-02-02 ENCOUNTER — Encounter: Payer: Self-pay | Admitting: Neurology

## 2022-03-01 DIAGNOSIS — E7800 Pure hypercholesterolemia, unspecified: Secondary | ICD-10-CM | POA: Diagnosis not present

## 2022-03-01 DIAGNOSIS — I10 Essential (primary) hypertension: Secondary | ICD-10-CM | POA: Diagnosis not present

## 2022-03-01 DIAGNOSIS — F4321 Adjustment disorder with depressed mood: Secondary | ICD-10-CM | POA: Diagnosis not present

## 2022-03-01 DIAGNOSIS — K219 Gastro-esophageal reflux disease without esophagitis: Secondary | ICD-10-CM | POA: Diagnosis not present

## 2022-04-01 NOTE — Progress Notes (Signed)
NEUROLOGY CONSULTATION NOTE  Denise Hawkins MRN: [id number] DOB: Jul 30, 1941  Referring provider: Yaakov Guthrie, MD Primary care provider: Yaakov Guthrie, MD  Reason for consult:  migraine  Assessment/Plan:   Recurrent episodes of transient altered awareness - consider seizures.  Migraine also possible Migraine without aura, without status migrainosus, not intractable   Will try to get an MRI of brain.  She has significant claustrophobia, even for CT.  Will have her inquire about the open MRI at [facility] and advised to let us know if she wishes to proceed.   Routine EEG After EEG, will likely start a medication to treat potential seizure such as lamotrigine or levetiracetam Otherwise, follow up 4 months.   Subjective:  Denise Hawkins is an 81 year old female with HTN, lumbar spinal stenosis and history of DVT and pleomorphic adenoma of salivary gland who presents for migraines.  History supplemented by referring provider's note.  She is accompanied by her daughter who also supplements history.  She started having "spells" since December 2022.  She was stepping out of her kitchen when the phone rang.  She walked back into the kitchen when she suddenly felt like "I was in a bubble" with generalized weakness.  Legs became weak and almost gave out.  She answered the phone and she must have sounded altered because her son, on the line, asked what was wrong.  She was able to answer her son on the line.  Symptoms gradually resolved   It lasted 20 minutes but felt drained for the rest of the rest of the day or two.  No loss of consciousness.  Some associated diaphoresis and nausea but no phantosmia or visual phenomena.  She may become confused afterwards for 10 minutes, sometimes may get day and night mixed up.  Mild spells occur once a week but more severe spells occur about once or twice a month.  Carotid ultrasound at that time showed mild atherosclerotic plaque in the left ICA but no  hemodynamically significant stenosis.  No history of head trauma or prior seizures.    She has history of migraines since age 47.  They are severe diffuse pounding headache with nausea, photophobia, phonophobia and sometimes vomiting.  May last 1-2 days.  She takes an Ativan 0.'25mg'$  and arthritic strength Tylenol daily to prevent onset of migraines.  On this regimen, they occur once a month.  When she has the "spells", a migraine may occur    Current NSAIDS/analgesics:  Tylenol, tramadol '50mg'$  Current triptans:  none Current ergotamine:  none Current anti-emetic:  none Current muscle relaxants:  none Current Antihypertensive medications:  atenolol '25mg'$ , amlodipine, enalapril, HCTZ Current Antidepressant medications:  none Current Anticonvulsant medications:  none Current anti-CGRP:  none Current Vitamins/Herbal/Supplements:  none Current Antihistamines/Decongestants:  none Other therapy:  none Hormone/birth control:  none Other medications:  lorazepam 0.'5mg'$  BID PRN (anxiety), Eliquis      PAST MEDICAL HISTORY: Past Medical History:  Diagnosis Date   Anxiety    Arthritis    Gastritis    GERD (gastroesophageal reflux disease)    GERD (gastroesophageal reflux disease)    Headache(784.0)    migraines   Hypertension    PONV (postoperative nausea and vomiting)     PAST SURGICAL HISTORY: Past Surgical History:  Procedure Laterality Date   APPENDECTOMY     BACK SURGERY     BREAST SURGERY     right biopsy-benign   COLONOSCOPY     DILATION AND CURETTAGE OF UTERUS  x4   ESOPHAGOGASTRODUODENOSCOPY     EYE SURGERY     left with lens implant   SUBMANDIBULAR GLAND EXCISION Right 11/20/2017   Procedure: EXCISION  OF RIGHT SUBMANDIBULAR GLAND;  Surgeon: Jerrell Belfast, MD;  Location: [facility];  Service: ENT;  Laterality: Right;   TONSILLECTOMY     TOTAL HIP ARTHROPLASTY Right 07/07/2013   Procedure: RIGHT TOTAL HIP ARTHROPLASTY ANTERIOR APPROACH;  Surgeon: Mauri Pole, MD;   Location: [facility];  Service: Orthopedics;  Laterality: Right;    MEDICATIONS: Current Outpatient Medications on File Prior to Visit  Medication Sig Dispense Refill   acetaminophen (TYLENOL) 500 MG tablet Take 500 mg by mouth every 6 (six) hours as needed (for pain.).     amLODipine (NORVASC) 2.5 MG tablet Take 2.5 mg by mouth daily.     aspirin EC 81 MG tablet Take 1 tablet (81 mg total) by mouth daily. Swallow whole. 30 tablet 11   atenolol (TENORMIN) 25 MG tablet Take 25 mg by mouth every morning.     Chlorpheniramine-DM (CORICIDIN HBP COUGH/COLD PO) Take 1 tablet by mouth every 6 (six) hours as needed (for cold/congestion.).     Docusate Sodium (PHILLIPS LIQUI-GELS PO) Take 1 capsule by mouth daily as needed (for constipation.).     enalapril (VASOTEC) 20 MG tablet Take 20 mg by mouth 2 (two) times daily.     hydrochlorothiazide (MICROZIDE) 12.5 MG capsule Take 12.5 mg by mouth daily.     LORazepam (ATIVAN) 0.5 MG tablet Take 0.5 mg by mouth 2 (two) times daily as needed (for anxiety/migraine headaches.).      omeprazole (PRILOSEC) 20 MG capsule Take 20 mg by mouth daily before breakfast.      Probiotic Product (PROBIOTIC PO) Take 1 capsule by mouth daily.     sucralfate (CARAFATE) 1 g tablet Take 1 g by mouth 4 (four) times daily -  before meals and at bedtime.     No current facility-administered medications on file prior to visit.    ALLERGIES: Allergies  Allergen Reactions   Latex Rash    Blisters   Penicillins Rash    Has patient had a PCN reaction causing immediate rash, facial/tongue/throat swelling, SOB or lightheadedness with hypotension: Yes Has patient had a PCN reaction causing severe rash involving mucus membranes or skin necrosis: Unknown Has patient had a PCN reaction that required hospitalization: No Has patient had a PCN reaction occurring within the last 10 years:No If all of the above answers are "NO", then may proceed with Cephalosporin use.    Sulfa  Antibiotics Rash    FAMILY HISTORY: Family History  Problem Relation Age of Onset   Heart disease Father     Objective:  Blood pressure (!) 159/93, pulse (!) 59, height '5\' 8"'$  (1.727 m), weight 158 lb 9.6 oz (71.9 kg), SpO2 98 %. General: No acute distress.  Patient appears well-groomed.   Head:  Normocephalic/atraumatic Eyes:  fundi examined but not visualized Neck: supple, no paraspinal tenderness, full range of motion Back: No paraspinal tenderness Heart: regular rate and rhythm Lungs: Clear to auscultation bilaterally. Vascular: No carotid bruits. Neurological Exam: Mental status: alert and oriented to person, place, and time, speech fluent and not dysarthric, language intact. Cranial nerves: CN I: not tested CN II: pupils equal, round and reactive to light, visual fields intact CN III, IV, VI:  full range of motion, no nystagmus, no ptosis CN V: facial sensation intact. CN VII: upper and lower face symmetric CN VIII:  hearing intact CN IX, X: gag intact, uvula midline CN XI: sternocleidomastoid and trapezius muscles intact CN XII: tongue midline Bulk & Tone: normal, no fasciculations. Motor:  muscle strength 5/5 throughout Sensation:  Temperature and vibratory sensation reduced in feet Deep Tendon Reflexes:  absent throughout,  toes downgoing.   Finger to nose testing:  Without dysmetria.   Gait:  Broad-based, antalgic.  Ambulates with cane.  Romberg negative.    Thank you for allowing me to take part in the care of this patient.  Metta Clines, DO  CC: Yaakov Guthrie, MD

## 2022-04-02 ENCOUNTER — Ambulatory Visit: Payer: PPO | Admitting: Neurology

## 2022-04-02 ENCOUNTER — Encounter: Payer: Self-pay | Admitting: Neurology

## 2022-04-02 VITALS — BP 159/93 | HR 59 | Ht 68.0 in | Wt 158.6 lb

## 2022-04-02 DIAGNOSIS — G43009 Migraine without aura, not intractable, without status migrainosus: Secondary | ICD-10-CM | POA: Diagnosis not present

## 2022-04-02 DIAGNOSIS — R404 Transient alteration of awareness: Secondary | ICD-10-CM

## 2022-04-02 NOTE — Patient Instructions (Addendum)
We will try to get an MRI performed with the open MRI at [facility].  May take an ativan prior to the study.  Let me know if you wish to proceed Will order EEG After EEG, will start a medication to treat for possible seizure. Follow up 4 months

## 2022-04-10 ENCOUNTER — Encounter: Payer: Self-pay | Admitting: Neurology

## 2022-04-27 DIAGNOSIS — Z23 Encounter for immunization: Secondary | ICD-10-CM | POA: Diagnosis not present

## 2022-04-27 DIAGNOSIS — I10 Essential (primary) hypertension: Secondary | ICD-10-CM | POA: Diagnosis not present

## 2022-04-27 DIAGNOSIS — R7989 Other specified abnormal findings of blood chemistry: Secondary | ICD-10-CM | POA: Diagnosis not present

## 2022-05-25 ENCOUNTER — Ambulatory Visit (HOSPITAL_COMMUNITY)
Admission: RE | Admit: 2022-05-25 | Discharge: 2022-05-25 | Disposition: A | Discharge status: Home / Self Care | Payer: PPO | Source: Ambulatory Visit | Attending: Neurology | Admitting: Neurology

## 2022-05-25 DIAGNOSIS — R404 Transient alteration of awareness: Secondary | ICD-10-CM | POA: Insufficient documentation

## 2022-05-25 NOTE — Procedures (Signed)
ELECTROENCEPHALOGRAM REPORT  Date of Study: 05/25/2022  Patient's Name: Denise Hawkins MRN: [id number] Date of Birth: 05/20/1941   Clinical History: 81 year old female with recurrent episodes of transient altered awareness.    Medications: Atenolol Amlodipine Enalapril HCTZ Lorazepam PRN Eliquis  Technical Summary: A multichannel digital EEG recording measured by the international 10-20 system with electrodes applied with paste and impedances below 5000 ohms performed in our laboratory with EKG monitoring in an awake patient.  Hyperventilation and photic stimulation were not performed.  The digital EEG was referentially recorded, reformatted, and digitally filtered in a variety of bipolar and referential montages for optimal display.    Description: The patient is awake during the recording.  During maximal wakefulness, there is a symmetric, medium voltage 10 Hz posterior dominant rhythm that attenuates with eye opening.  The record is symmetric.  Stage 2 sleep was not seen.  There were no epileptiform discharges or electrographic seizures seen.    EKG lead was unremarkable.  Impression: This awake EEG is normal.    Clinical Correlation: A normal EEG does not exclude a clinical diagnosis of epilepsy.  If further clinical questions remain, prolonged EEG may be helpful.  Clinical correlation is advised.   Metta Clines, DO

## 2022-05-25 NOTE — Progress Notes (Signed)
EEG complete - results pending 

## 2022-05-28 ENCOUNTER — Ambulatory Visit (HOSPITAL_COMMUNITY): Payer: PPO

## 2022-05-28 ENCOUNTER — Other Ambulatory Visit: Payer: Self-pay

## 2022-05-28 MED ORDER — LEVETIRACETAM 500 MG PO TABS: 500.0000 mg | ORAL_TABLET | Freq: Two times a day (BID) | ORAL | 0 refills | Status: DC

## 2022-06-12 DIAGNOSIS — R490 Dysphonia: Secondary | ICD-10-CM | POA: Diagnosis not present

## 2022-06-14 DIAGNOSIS — E7800 Pure hypercholesterolemia, unspecified: Secondary | ICD-10-CM | POA: Diagnosis not present

## 2022-06-14 DIAGNOSIS — F4321 Adjustment disorder with depressed mood: Secondary | ICD-10-CM | POA: Diagnosis not present

## 2022-06-14 DIAGNOSIS — K2100 Gastro-esophageal reflux disease with esophagitis, without bleeding: Secondary | ICD-10-CM | POA: Diagnosis not present

## 2022-06-14 DIAGNOSIS — I10 Essential (primary) hypertension: Secondary | ICD-10-CM | POA: Diagnosis not present

## 2022-07-03 ENCOUNTER — Ambulatory Visit: Payer: PPO | Admitting: Neurology

## 2022-07-20 NOTE — Progress Notes (Unsigned)
NEUROLOGY FOLLOW UP OFFICE NOTE  Denise Hawkins [id number]  Assessment/Plan:   Recurrent episodes of transient altered awareness - consider seizures.  Migraine also possible Migraine without aura, without status migrainosus, not intractable    Will try to get an MRI of brain.  She has significant claustrophobia, even for CT.  Will have her inquire about the open MRI at [facility] and advised to let us know if she wishes to proceed.   Routine EEG After EEG, will likely start a medication to treat potential seizure such as lamotrigine or levetiracetam Otherwise, follow up 4 months.     Subjective:  Denise Hawkins is an 81 year old female with HTN, lumbar spinal stenosis and history of DVT and pleomorphic adenoma of salivary gland who follows up for migraines and spells.  She is accompanied by her daughter who supplements history.  UPDATE: MRI of brain was ordered ***.  EEG on 05/25/2022 was normal.  Started Keppra '500mg'$  twice daily.  ***  Current NSAIDS/analgesics:  Tylenol, tramadol '50mg'$  Current triptans:  none Current ergotamine:  none Current anti-emetic:  none Current muscle relaxants:  none Current Antihypertensive medications:  atenolol '25mg'$ , amlodipine, enalapril, HCTZ Current Antidepressant medications:  none Current Anticonvulsant medications:  Keppra '500mg'$  twice daily Current anti-CGRP:  none Current Vitamins/Herbal/Supplements:  none Current Antihistamines/Decongestants:  none Other therapy:  none Hormone/birth control:  none Other medications:  lorazepam 0.'5mg'$  BID PRN (anxiety), Eliquis   HISTORY: She started having "spells" since December 2022.  She was stepping out of her kitchen when the phone rang.  She walked back into the kitchen when she suddenly felt like "I was in a bubble" with generalized weakness.  Legs became weak and almost gave out.  She answered the phone and she must have sounded altered because her son, on the line, asked what was wrong.  She  was able to answer her son on the line.  Symptoms gradually resolved   It lasted 20 minutes but felt drained for the rest of the rest of the day or two.  No loss of consciousness.  Some associated diaphoresis and nausea but no phantosmia or visual phenomena.  She may become confused afterwards for 10 minutes, sometimes may get day and night mixed up.  Mild spells occur once a week but more severe spells occur about once or twice a month.  Carotid ultrasound at that time showed mild atherosclerotic plaque in the left ICA but no hemodynamically significant stenosis.  No history of head trauma or prior seizures.     She has history of migraines since age 33.  They are severe diffuse pounding headache with nausea, photophobia, phonophobia and sometimes vomiting.  May last 1-2 days.  She takes an Ativan 0.'25mg'$  and arthritic strength Tylenol daily to prevent onset of migraines.  On this regimen, they occur once a month.  When she has the "spells", a migraine may occur        PAST MEDICAL HISTORY: Past Medical History:  Diagnosis Date   Anxiety    Arthritis    Gastritis    GERD (gastroesophageal reflux disease)    GERD (gastroesophageal reflux disease)    Headache(784.0)    migraines   Hypertension    PONV (postoperative nausea and vomiting)     MEDICATIONS: Current Outpatient Medications on File Prior to Visit  Medication Sig Dispense Refill   acetaminophen (TYLENOL) 500 MG tablet Take 500 mg by mouth every 6 (six) hours as needed (for pain.).  amLODipine (NORVASC) 2.5 MG tablet Take 2.5 mg by mouth daily.     atenolol (TENORMIN) 25 MG tablet Take 25 mg by mouth every morning.     Chlorpheniramine-DM (CORICIDIN HBP COUGH/COLD PO) Take 1 tablet by mouth every 6 (six) hours as needed (for cold/congestion.).     Docusate Sodium (PHILLIPS LIQUI-GELS PO) Take 1 capsule by mouth daily as needed (for constipation.).     ELIQUIS 5 MG TABS tablet Take 5 mg by mouth daily.     enalapril (VASOTEC) 20  MG tablet Take 20 mg by mouth 2 (two) times daily.     hydrochlorothiazide (MICROZIDE) 12.5 MG capsule Take 12.5 mg by mouth daily.     levETIRAcetam (KEPPRA) 500 MG tablet Take 1 tablet (500 mg total) by mouth 2 (two) times daily. 120 tablet 0   LORazepam (ATIVAN) 0.5 MG tablet Take 0.5 mg by mouth 2 (two) times daily as needed (for anxiety/migraine headaches.).      omeprazole (PRILOSEC) 20 MG capsule Take 20 mg by mouth daily before breakfast.      sucralfate (CARAFATE) 1 g tablet Take 1 g by mouth 4 (four) times daily -  before meals and at bedtime.     No current facility-administered medications on file prior to visit.    ALLERGIES: Allergies  Allergen Reactions   Latex Rash    Blisters   Penicillins Rash    Has patient had a PCN reaction causing immediate rash, facial/tongue/throat swelling, SOB or lightheadedness with hypotension: Yes Has patient had a PCN reaction causing severe rash involving mucus membranes or skin necrosis: Unknown Has patient had a PCN reaction that required hospitalization: No Has patient had a PCN reaction occurring within the last 10 years:No If all of the above answers are "NO", then may proceed with Cephalosporin use.    Sulfa Antibiotics Rash    FAMILY HISTORY: Family History  Problem Relation Age of Onset   Migraines Mother    Heart disease Father       Objective:  *** General: No acute distress.  Patient appears ***-groomed.   Head:  Normocephalic/atraumatic Eyes:  Fundi examined but not visualized Neck: supple, no paraspinal tenderness, full range of motion Heart:  Regular rate and rhythm Lungs:  Clear to auscultation bilaterally Back: No paraspinal tenderness Neurological Exam: alert and oriented to person, place, and time.  Speech fluent and not dysarthric, language intact.  CN II-XII intact. Bulk and tone normal, muscle strength 5/5 throughout.  Sensation to light touch intact.  Deep tendon reflexes 2+ throughout, toes downgoing.   Finger to nose testing intact.  Gait normal, Romberg negative.   Metta Clines, DO  CC: ***

## 2022-07-23 ENCOUNTER — Ambulatory Visit: Payer: PPO | Admitting: Neurology

## 2022-07-24 ENCOUNTER — Ambulatory Visit (INDEPENDENT_AMBULATORY_CARE_PROVIDER_SITE_OTHER): Payer: PPO | Admitting: Neurology

## 2022-07-24 ENCOUNTER — Encounter: Payer: Self-pay | Admitting: Neurology

## 2022-07-24 VITALS — BP 158/80 | HR 63 | Ht 68.0 in | Wt 159.2 lb

## 2022-07-24 DIAGNOSIS — R404 Transient alteration of awareness: Secondary | ICD-10-CM

## 2022-07-24 DIAGNOSIS — G43009 Migraine without aura, not intractable, without status migrainosus: Secondary | ICD-10-CM

## 2022-07-24 MED ORDER — ZONISAMIDE 25 MG PO CAPS: ORAL_CAPSULE | ORAL | 0 refills | Status: DC

## 2022-07-24 NOTE — Patient Instructions (Signed)
Start zonisamide '25mg'$  - take 1 pill daily for one week, then 2 pills daily for one week, then 3 pills daily for one week, then 4 pills daily - contact me for refill Continue levetiracetam 1/2 tablet twice daily.  Once you are taking zonisamide 4 pills daily, you may stop levetiracetam Use tramadol as needed for migraine attacks Follow up 4 months. Contact me if you would like to proceed with CT of head.

## 2022-07-30 ENCOUNTER — Other Ambulatory Visit: Payer: Self-pay

## 2022-07-30 ENCOUNTER — Other Ambulatory Visit: Payer: Self-pay | Admitting: Neurology

## 2022-07-30 DIAGNOSIS — R519 Headache, unspecified: Secondary | ICD-10-CM

## 2022-07-30 MED ORDER — DIAZEPAM 5 MG PO TABS: ORAL_TABLET | ORAL | 0 refills | Status: AC

## 2022-07-30 NOTE — Progress Notes (Signed)
Per Dr.Jaffe, OK to order CT head for worsening headaches.  Diazepam sent to [facility].  She will need a driver to and from the imaging facility.

## 2022-08-01 ENCOUNTER — Ambulatory Visit: Payer: PPO | Admitting: Neurology

## 2022-08-08 ENCOUNTER — Ambulatory Visit: Payer: PPO | Admitting: Neurology

## 2022-08-16 ENCOUNTER — Ambulatory Visit: Payer: PPO | Admitting: Neurology

## 2022-08-22 ENCOUNTER — Encounter: Payer: Self-pay | Admitting: Neurology

## 2022-08-29 ENCOUNTER — Other Ambulatory Visit: Payer: Self-pay | Admitting: Neurology

## 2022-08-29 MED ORDER — TRAMADOL HCL 50 MG PO TABS: 50.0000 mg | ORAL_TABLET | Freq: Every day | ORAL | 2 refills | Status: DC | PRN

## 2022-08-31 ENCOUNTER — Other Ambulatory Visit: Payer: Self-pay | Admitting: Neurology

## 2022-09-05 ENCOUNTER — Ambulatory Visit
Admission: RE | Admit: 2022-09-05 | Discharge: 2022-09-05 | Disposition: A | Discharge status: Home / Self Care | Payer: PPO | Source: Ambulatory Visit | Attending: Neurology | Admitting: Neurology

## 2022-09-05 DIAGNOSIS — R519 Headache, unspecified: Secondary | ICD-10-CM

## 2022-09-07 ENCOUNTER — Telehealth: Payer: Self-pay | Admitting: Neurology

## 2022-09-07 NOTE — Telephone Encounter (Signed)
Pt's daughter called back in stating she got a missed call about her mother's results

## 2022-12-19 NOTE — Progress Notes (Signed)
NEUROLOGY FOLLOW UP OFFICE NOTE  Denise Hawkins [id number]  Assessment/Plan:   Recurrent episodes of transient altered awareness - unclear if these represent partial seizures or related to anxiety Chronic migraine without aura, without status migrainosus, intractable     Advised to take levetiracetam 250mg  twice daily for now (cannot tolerate higher doses).  Will obtain recent labs from PCP's office (CBC and CMP).  After review, plan will be to start Depakote 125mg  daily Tramadol as needed for migraines Recommended CT head instead of MRI.  At this time, she defers.  She will contact me if she changes her mind. Otherwise, follow up in 4 months.     Subjective:  Denise Hawkins is an 82 year old female with HTN, lumbar spinal stenosis and history of DVT and pleomorphic adenoma of salivary gland who follows up for migraines and spells.  She is accompanied by her daughter who supplements history.   UPDATE: Transitioned from levetiracetam to zonisamide because the levetiracetam causes nightmares.  She stopped taking it because it makes her feel "weird".  Now, she takes 1/2 a levetiracetam inconsistently.  She has a spell once in awhile.  They now occur 1 to 3 times a month.    Has a persistent dull headache daily since childhood.  Migraine headaches occur 2-3 times a week.  Treats with Tylenol Arthritis and Ativan.  Will also take a tramadol once in awhile (infrequent)   Current NSAIDS/analgesics:  Tylenol, tramadol 50mg  Current triptans:  none Current ergotamine:  none Current anti-emetic:  none Current muscle relaxants:  none Current Antihypertensive medications:  atenolol 25mg , amlodipine, enalapril, HCTZ Current Antidepressant medications:  none Current Anticonvulsant medications:  levetiracetam 125mg -250mg  once a day Current anti-CGRP:  none Current Vitamins/Herbal/Supplements:  none Current Antihistamines/Decongestants:  none Other therapy:  none Hormone/birth control:   none Other medications:  lorazepam 0.5mg  BID PRN daily (anxiety), Eliquis   HISTORY: She started having "spells" since December 2022.  She was stepping out of her kitchen when the phone rang.  She walked back into the kitchen when she suddenly felt like "I was in a bubble" with generalized weakness.  Legs became weak and almost gave out.  She answered the phone and she must have sounded altered because her son, on the line, asked what was wrong.  She was able to answer her son on the line.  Symptoms gradually resolved   It lasted 20 minutes but felt drained for the rest of the rest of the day or two.  No loss of consciousness.  Some associated diaphoresis and nausea but no phantosmia or visual phenomena.  She may become confused afterwards for 10 minutes, sometimes may get day and night mixed up.  Mild spells occur once a week but more severe spells occur about once or twice a month.  Carotid ultrasound at that time showed mild atherosclerotic plaque in the left ICA but no hemodynamically significant stenosis.  No history of head trauma or prior seizures.  MRI of brain was ordered but she is to phobic to proceed with it.  Deferred anxiolytic or CT.  EEG on 05/25/2022 was normal.   She has history of migraines since age 39.  They are severe diffuse pounding headache with nausea, photophobia, phonophobia and sometimes vomiting.  May last 1-3 days.  Often occur in clusters occurring off and on for 2-4 weeks.  Then she may go 4 weeks without a headache.  She takes an Ativan 0.25mg  and arthritic strength Tylenol daily to prevent  onset of migraines.  On this regimen, they occur once a month.  When she has the "spells", a migraine may occur   Past medications:  zonisamide  PAST MEDICAL HISTORY: Past Medical History:  Diagnosis Date   Anxiety    Arthritis    Gastritis    GERD (gastroesophageal reflux disease)    GERD (gastroesophageal reflux disease)    Headache(784.0)    migraines   Hypertension    PONV  (postoperative nausea and vomiting)     MEDICATIONS: Current Outpatient Medications on File Prior to Visit  Medication Sig Dispense Refill   traMADol (ULTRAM) 50 MG tablet Take 1 tablet (50 mg total) by mouth daily as needed. 15 tablet 2   acetaminophen (TYLENOL) 500 MG tablet Take 500 mg by mouth every 6 (six) hours as needed (for pain.).     aluminum hydroxide-magnesium carbonate (GAVISCON) 95-358 MG/15ML SUSP Take 15 mLs by mouth as needed.     amLODipine (NORVASC) 2.5 MG tablet Take 2.5 mg by mouth daily.     atenolol (TENORMIN) 25 MG tablet Take 25 mg by mouth every morning.     Chlorpheniramine-DM (CORICIDIN HBP COUGH/COLD PO) Take 1 tablet by mouth every 6 (six) hours as needed (for cold/congestion.).     diazepam (VALIUM) 5 MG tablet Take 1 tablet 30-40 minutes prior to CT 1 tablet 0   Docusate Sodium (PHILLIPS LIQUI-GELS PO) Take 1 capsule by mouth daily as needed (for constipation.).     ELIQUIS 5 MG TABS tablet Take 5 mg by mouth daily.     enalapril (VASOTEC) 20 MG tablet Take 20 mg by mouth 2 (two) times daily.     hydrochlorothiazide (MICROZIDE) 12.5 MG capsule Take 12.5 mg by mouth daily.     levETIRAcetam (KEPPRA) 500 MG tablet Take 1/2 tablet twice daily 90 tablet 1   LORazepam (ATIVAN) 0.5 MG tablet Take 0.5 mg by mouth 2 (two) times daily as needed (for anxiety/migraine headaches.).      omeprazole (PRILOSEC) 20 MG capsule Take 20 mg by mouth daily before breakfast.      sucralfate (CARAFATE) 1 g tablet Take 1 g by mouth 4 (four) times daily -  before meals and at bedtime.     zonisamide (ZONEGRAN) 25 MG capsule Take 1 capsule daily for one week, then take 2 capsules daily for one week, then take 3 capsules daily for one week, then take 4 capsules daily 120 capsule 0   No current facility-administered medications on file prior to visit.    ALLERGIES: Allergies  Allergen Reactions   Latex Rash    Blisters   Penicillins Rash    Has patient had a PCN reaction causing  immediate rash, facial/tongue/throat swelling, SOB or lightheadedness with hypotension: Yes Has patient had a PCN reaction causing severe rash involving mucus membranes or skin necrosis: Unknown Has patient had a PCN reaction that required hospitalization: No Has patient had a PCN reaction occurring within the last 10 years:No If all of the above answers are "NO", then may proceed with Cephalosporin use.    Sulfa Antibiotics Rash    FAMILY HISTORY: Family History  Problem Relation Age of Onset   Migraines Mother    Heart disease Father       Objective:  Blood pressure 127/80, pulse 66, height 5\' 6"  (1.676 m), weight 152 lb 6.4 oz (69.1 kg), SpO2 97 %. General: No acute distress.  Patient appears well-groomed.   Head:  Normocephalic/atraumatic Eyes:  Fundi examined but not  visualized Neck: supple, no paraspinal tenderness, full range of motion Heart:  Regular rate and rhythm Neurological Exam: alert and oriented.  Speech fluent and not dysarthric, language intact.  CN II-XII intact. Bulk and tone normal, muscle strength 5/5 throughout.  Sensation to light touch intact.  Deep tendon reflexes absent throughout.  Finger to nose testing intact.  Broad based antalgic gait.  Ambulates with cane.  Romberg with mild sway.   Shon Millet, DO  CC: Jackelyn Poling, DO

## 2022-12-21 ENCOUNTER — Ambulatory Visit (INDEPENDENT_AMBULATORY_CARE_PROVIDER_SITE_OTHER): Payer: PPO | Admitting: Neurology

## 2022-12-21 ENCOUNTER — Encounter: Payer: Self-pay | Admitting: Neurology

## 2022-12-21 VITALS — BP 127/80 | HR 66 | Ht 66.0 in | Wt 152.4 lb

## 2022-12-21 DIAGNOSIS — G43709 Chronic migraine without aura, not intractable, without status migrainosus: Secondary | ICD-10-CM

## 2022-12-21 DIAGNOSIS — R404 Transient alteration of awareness: Secondary | ICD-10-CM

## 2022-12-21 NOTE — Patient Instructions (Signed)
Continue levetiracetam 1/2 tablet twice daily Will get labs from your PCP.  After I review them, will start divalproex to treat chronic headache as well as these spells Limit use of pain relievers to no more than 2 days out of week to prevent risk of rebound or medication-overuse headache. Follow up 6 months

## 2022-12-25 ENCOUNTER — Other Ambulatory Visit: Payer: Self-pay | Admitting: Neurology

## 2023-01-19 ENCOUNTER — Other Ambulatory Visit (HOSPITAL_BASED_OUTPATIENT_CLINIC_OR_DEPARTMENT_OTHER): Payer: Self-pay | Admitting: Family Medicine

## 2023-01-19 DIAGNOSIS — R1013 Epigastric pain: Secondary | ICD-10-CM

## 2023-01-19 DIAGNOSIS — K2100 Gastro-esophageal reflux disease with esophagitis, without bleeding: Secondary | ICD-10-CM

## 2023-01-19 DIAGNOSIS — K219 Gastro-esophageal reflux disease without esophagitis: Secondary | ICD-10-CM

## 2023-01-25 ENCOUNTER — Ambulatory Visit (HOSPITAL_BASED_OUTPATIENT_CLINIC_OR_DEPARTMENT_OTHER)
Admission: RE | Admit: 2023-01-25 | Discharge: 2023-01-25 | Disposition: A | Discharge status: Home / Self Care | Payer: PPO | Source: Ambulatory Visit | Attending: Family Medicine | Admitting: Family Medicine

## 2023-01-25 DIAGNOSIS — K449 Diaphragmatic hernia without obstruction or gangrene: Secondary | ICD-10-CM | POA: Diagnosis present

## 2023-01-25 DIAGNOSIS — K2100 Gastro-esophageal reflux disease with esophagitis, without bleeding: Secondary | ICD-10-CM | POA: Diagnosis present

## 2023-01-25 DIAGNOSIS — K219 Gastro-esophageal reflux disease without esophagitis: Secondary | ICD-10-CM | POA: Insufficient documentation

## 2023-01-25 DIAGNOSIS — R1013 Epigastric pain: Secondary | ICD-10-CM | POA: Diagnosis present

## 2023-02-21 ENCOUNTER — Other Ambulatory Visit: Payer: Self-pay | Admitting: Neurology

## 2023-02-28 ENCOUNTER — Other Ambulatory Visit: Payer: Self-pay | Admitting: Neurology

## 2023-03-20 ENCOUNTER — Other Ambulatory Visit: Payer: Self-pay | Admitting: Neurology

## 2023-03-20 ENCOUNTER — Encounter: Payer: Self-pay | Admitting: Neurology

## 2023-03-20 MED ORDER — ALPRAZOLAM 0.5 MG PO TABS: ORAL_TABLET | ORAL | 0 refills | Status: DC

## 2023-03-21 ENCOUNTER — Other Ambulatory Visit: Payer: Self-pay

## 2023-03-21 DIAGNOSIS — R519 Headache, unspecified: Secondary | ICD-10-CM

## 2023-03-21 NOTE — Progress Notes (Signed)
Per Dr.Jaffe,  OK to schedule for open MRI at [facility].    MRI Brain W./O Contrast sent to [facility].

## 2023-04-09 ENCOUNTER — Other Ambulatory Visit: Payer: Self-pay | Admitting: Neurology

## 2023-04-12 ENCOUNTER — Emergency Department (HOSPITAL_COMMUNITY)
Admission: EM | Admit: 2023-04-12 | Discharge: 2023-04-12 | Disposition: A | Discharge status: Home / Self Care | Payer: PPO | Attending: Student | Admitting: Student

## 2023-04-12 ENCOUNTER — Encounter (HOSPITAL_COMMUNITY): Payer: Self-pay

## 2023-04-12 ENCOUNTER — Emergency Department (HOSPITAL_COMMUNITY): Payer: PPO

## 2023-04-12 ENCOUNTER — Other Ambulatory Visit: Payer: Self-pay

## 2023-04-12 DIAGNOSIS — Z79899 Other long term (current) drug therapy: Secondary | ICD-10-CM | POA: Diagnosis not present

## 2023-04-12 DIAGNOSIS — K5900 Constipation, unspecified: Secondary | ICD-10-CM | POA: Diagnosis not present

## 2023-04-12 DIAGNOSIS — K5641 Fecal impaction: Secondary | ICD-10-CM

## 2023-04-12 DIAGNOSIS — I10 Essential (primary) hypertension: Secondary | ICD-10-CM | POA: Insufficient documentation

## 2023-04-12 DIAGNOSIS — Z91040 Latex allergy status: Secondary | ICD-10-CM | POA: Diagnosis not present

## 2023-04-12 DIAGNOSIS — K625 Hemorrhage of anus and rectum: Secondary | ICD-10-CM | POA: Insufficient documentation

## 2023-04-12 DIAGNOSIS — K219 Gastro-esophageal reflux disease without esophagitis: Secondary | ICD-10-CM | POA: Diagnosis not present

## 2023-04-12 DIAGNOSIS — R1084 Generalized abdominal pain: Secondary | ICD-10-CM

## 2023-04-12 DIAGNOSIS — Z86718 Personal history of other venous thrombosis and embolism: Secondary | ICD-10-CM | POA: Diagnosis not present

## 2023-04-12 DIAGNOSIS — Z7901 Long term (current) use of anticoagulants: Secondary | ICD-10-CM | POA: Diagnosis not present

## 2023-04-12 HISTORY — DX: Acute embolism and thrombosis of unspecified deep veins of unspecified lower extremity: I82.409

## 2023-04-12 LAB — COMPREHENSIVE METABOLIC PANEL
ALT: 12 U/L (ref 0–44)
AST: 18 U/L (ref 15–41)
Albumin: 3.8 g/dL (ref 3.5–5.0)
Alkaline Phosphatase: 57 U/L (ref 38–126)
Anion gap: 10 (ref 5–15)
BUN: 18 mg/dL (ref 8–23)
CO2: 27 mmol/L (ref 22–32)
Calcium: 9.8 mg/dL (ref 8.9–10.3)
Chloride: 94 mmol/L — ABNORMAL LOW (ref 98–111)
Creatinine, Ser: 1.29 mg/dL — ABNORMAL HIGH (ref 0.44–1.00)
GFR, Estimated: 41 mL/min — ABNORMAL LOW (ref >60)
Glucose, Bld: 150 mg/dL — ABNORMAL HIGH (ref 70–99)
Potassium: 3.7 mmol/L (ref 3.5–5.1)
Sodium: 131 mmol/L — ABNORMAL LOW (ref 135–145)
Total Bilirubin: 0.9 mg/dL (ref 0.3–1.2)
Total Protein: 6.9 g/dL (ref 6.5–8.1)

## 2023-04-12 LAB — CBC WITH DIFFERENTIAL/PLATELET
Abs Immature Granulocytes: 0.07 10*3/uL (ref 0.00–0.07)
Basophils Absolute: 0.1 10*3/uL (ref 0.0–0.1)
Basophils Relative: 1 %
Eosinophils Absolute: 0 10*3/uL (ref 0.0–0.5)
Eosinophils Relative: 0 %
HCT: 38.1 % (ref 36.0–46.0)
Hemoglobin: 12.7 g/dL (ref 12.0–15.0)
Immature Granulocytes: 1 %
Lymphocytes Relative: 8 %
Lymphs Abs: 0.9 10*3/uL (ref 0.7–4.0)
MCH: 30.5 pg (ref 26.0–34.0)
MCHC: 33.3 g/dL (ref 30.0–36.0)
MCV: 91.4 fL (ref 80.0–100.0)
Monocytes Absolute: 0.6 10*3/uL (ref 0.1–1.0)
Monocytes Relative: 6 %
Neutro Abs: 9.7 10*3/uL — ABNORMAL HIGH (ref 1.7–7.7)
Neutrophils Relative %: 84 %
Platelets: 250 10*3/uL (ref 150–400)
RBC: 4.17 MIL/uL (ref 3.87–5.11)
RDW: 12.9 % (ref 11.5–15.5)
WBC: 11.4 10*3/uL — ABNORMAL HIGH (ref 4.0–10.5)
nRBC: 0 % (ref 0.0–0.2)

## 2023-04-12 LAB — LIPASE, BLOOD: Lipase: 29 U/L (ref 11–51)

## 2023-04-12 LAB — POC OCCULT BLOOD, ED: Fecal Occult Bld: POSITIVE — AB

## 2023-04-12 MED ORDER — TRAMADOL HCL 50 MG PO TABS
50.0000 mg | ORAL_TABLET | Freq: Once | ORAL | Status: AC
  Administered 2023-04-12: 50 mg via ORAL
  Filled 2023-04-12: qty 1

## 2023-04-12 MED ORDER — FLEET ENEMA RE ENEM
1.0000 | ENEMA | Freq: Once | RECTAL | Status: AC
  Administered 2023-04-12: 1 via RECTAL
  Filled 2023-04-12: qty 1

## 2023-04-12 MED ORDER — SODIUM CHLORIDE (PF) 0.9 % IJ SOLN
INTRAMUSCULAR | Status: AC
  Filled 2023-04-12: qty 50

## 2023-04-12 MED ORDER — IOHEXOL 300 MG/ML  SOLN
80.0000 mL | Freq: Once | INTRAMUSCULAR | Status: AC | PRN
  Administered 2023-04-12: 80 mL via INTRAVENOUS

## 2023-04-12 NOTE — ED Provider Notes (Cosign Needed Addendum)
Accepted handoff at shift change from Army Melia PA-C. Please see prior provider note for more detail.   Briefly: Patient is 82 y.o. presenting for constipation and trace blood in the stool.  On Eliquis.  DDX: concern for hemorrhoids, mass, diverticulitis, fissure   Plan: f/u on CT and reasess. Consider enema.   Physical Exam  BP (!) 148/73 (BP Location: Left Arm)   Pulse 61   Temp 97.7 F (36.5 C) (Oral)   Resp 18   Ht 5\' 8"  (1.727 m)   Wt 67.1 kg   SpO2 99%   BMI 22.50 kg/m   Physical Exam  Procedures  Fecal disimpaction  Date/Time: 04/12/2023 9:06 AM  Performed by: Gareth Eagle, PA-C Authorized by: Gareth Eagle, PA-C  Risks and benefits: risks, benefits and alternatives were discussed Consent given by: patient and parent Patient understanding: patient states understanding of the procedure being performed Patient consent: the patient's understanding of the procedure matches consent given Patient identity confirmed: verbally with patient and hospital-assigned identification number Time out: Immediately prior to procedure a "time out" was called to verify the correct patient, procedure, equipment, support staff and site/side marked as required. Preparation: Patient was prepped and draped in the usual sterile fashion. Local anesthesia used: no  Anesthesia: Local anesthesia used: no  Sedation: Patient sedated: no  Patient tolerance: patient tolerated the procedure well with no immediate complications     ED Course / MDM   Clinical Course as of 04/12/23 1120  Fri Apr 12, 2023  0865 Constipated. On eliquis. Saw a few drops of blood in stool. CT scan pending. Ddx: diverticulitis, obstruction. Plan: if just constipated, then enema. F/u on CT.  [JR]  0924 Enema, miralax BID [JR]    Clinical Course User Index [JR] Gareth Eagle, PA-C   Medical Decision Making Amount and/or Complexity of Data Reviewed Labs: ordered. Radiology: ordered.  Risk OTC  drugs. Prescription drug management.    CT scan revealed concern for fecal impaction in the rectal vault along with possible anorectal mass.  Performed digital rectal exam with disimpaction.  Rectal vault was smooth without palpable masses, lesions.  Did note external hemorrhoids.  Was able to extract a small amount of stool from the vault.  Discussed patient with Dr. Dulce Sellar of GI who advised enema and to start her on MiraLAX twice daily and have her follow-up with GI outpatient.  He also felt that given the benign findings on digital rectal exam that she is safe for discharge.  Treated with Fleet enema x 1 and patient did have a bowel movement.  Advised to continue MiraLAX and milk of magnesia at home.  Advised to follow-up with GI.  Vital stable.  Discussed pertinent return precautions.  Discharged home in good condition.      Gareth Eagle, PA-C 04/12/23 1121    Linwood Dibbles, MD 04/13/23 [phone]

## 2023-04-12 NOTE — ED Triage Notes (Signed)
Pt reports that she does have hemorrhoids also.

## 2023-04-12 NOTE — Discharge Instructions (Addendum)
Evaluation today revealed that he did have fecal impaction in the rectum this is likely causing her constipation.  You were effectively treated with disimpaction and enema.  Recommend you follow-up with GI.  You can continue taking MiraLAX and milk of magnesia as you needed at home.  Also recommend incorporating fiber into your diet and adequate hydration.

## 2023-04-12 NOTE — ED Provider Notes (Signed)
[place] EMERGENCY DEPARTMENT AT [facility] Provider Note   CSN: [id number] Arrival date & time: 04/12/23  4098     History  Chief Complaint  Patient presents with   Rectal Bleeding    Denise Hawkins is a 82 y.o. female.  82 year female with history of HTN, GERD, DVT ( on Eliquis), brought in by EMS. States constipation x 1 week, no relief with MoM and Miralax, pain with defecating, reports hemorrhoids. Today noted a few stops of bright red blood on her pad. Nausea, flatulence as well. No recent dietary changes.  States she is starting to get a migraine and is requesting tramadol, states that if she does not treat her migraines early they get to be very miserable.       Home Medications Prior to Admission medications   Medication Sig Start Date End Date Taking? Authorizing Provider  ALPRAZolam Prudy Feeler) 0.5 MG tablet Take 1 tablet 30 minutes before MRI. 03/20/23   Drema Dallas, DO  acetaminophen (TYLENOL) 500 MG tablet Take 500 mg by mouth every 6 (six) hours as needed (for pain.).    [provider]  aluminum hydroxide-magnesium carbonate (GAVISCON) 95-358 MG/15ML SUSP Take 15 mLs by mouth as needed.    [provider]  amLODipine (NORVASC) 2.5 MG tablet Take 2.5 mg by mouth daily.    [provider]  atenolol (TENORMIN) 25 MG tablet Take 25 mg by mouth every morning.    [provider]  Chlorpheniramine-DM (CORICIDIN HBP COUGH/COLD PO) Take 1 tablet by mouth every 6 (six) hours as needed (for cold/congestion.).    [provider]  diazepam (VALIUM) 5 MG tablet Take 1 tablet 30-40 minutes prior to CT 07/30/22   Drema Dallas, DO  Docusate Sodium (PHILLIPS LIQUI-GELS PO) Take 1 capsule by mouth daily as needed (for constipation.).    [provider]  ELIQUIS 5 MG TABS tablet Take 5 mg by mouth daily. 02/28/22   [provider]  enalapril (VASOTEC) 20 MG tablet Take 20 mg by mouth 2 (two) times daily.     [provider]  hydrochlorothiazide (MICROZIDE) 12.5 MG capsule Take 12.5 mg by mouth daily.    [provider]  levETIRAcetam (KEPPRA) 500 MG tablet TAKE 1/2 TABLET BY MOUTH TWICE DAILY 02/22/23   Everlena Cooper, Adam R, DO  LORazepam (ATIVAN) 0.5 MG tablet Take 0.5 mg by mouth 2 (two) times daily as needed (for anxiety/migraine headaches.).     [provider]  omeprazole (PRILOSEC) 20 MG capsule Take 20 mg by mouth daily before breakfast.     [provider]  sucralfate (CARAFATE) 1 g tablet Take 1 g by mouth 4 (four) times daily -  before meals and at bedtime.    [provider]  traMADol (ULTRAM) 50 MG tablet TAKE ONE TABLET BY MOUTH DAILY AS NEEDED 02/28/23   Drema Dallas, DO      Allergies    Latex, Penicillins, and Sulfa antibiotics    Review of Systems   Review of Systems Negative except as per HPI Physical Exam Updated Vital Signs BP 138/78 (BP Location: Left Arm)   Pulse (!) 58   Temp 97.7 F (36.5 C) (Oral)   Resp 17   Ht 5\' 8"  (1.727 m)   Wt 67.1 kg   SpO2 98%   BMI 22.50 kg/m  Physical Exam Vitals and nursing note reviewed. Exam conducted with a chaperone present.  Constitutional:      General:  She is not in acute distress.    Appearance: She is well-developed. She is not diaphoretic.  HENT:     Head: Normocephalic and atraumatic.  Cardiovascular:     Rate and Rhythm: Normal rate and regular rhythm.     Pulses: Normal pulses.     Heart sounds: Normal heart sounds.  Pulmonary:     Effort: Pulmonary effort is normal.     Breath sounds: Normal breath sounds.  Abdominal:     Palpations: Abdomen is soft.     Tenderness: There is abdominal tenderness.  Genitourinary:    Comments: 2 external hemorrhoids, no active bleeding Musculoskeletal:     Right lower leg: No edema.     Left lower leg: No edema.  Skin:    General: Skin is warm and dry.  Neurological:     Mental Status: She is alert and oriented to person, place, and  time.  Psychiatric:        Behavior: Behavior normal.     ED Results / Procedures / Treatments   Labs (all labs ordered are listed, but only abnormal results are displayed) Labs Reviewed  CBC WITH DIFFERENTIAL/PLATELET - Abnormal; Notable for the following components:      Result Value   WBC 11.4 (*)    Neutro Abs 9.7 (*)    All other components within normal limits  COMPREHENSIVE METABOLIC PANEL - Abnormal; Notable for the following components:   Sodium 131 (*)    Chloride 94 (*)    Glucose, Bld 150 (*)    Creatinine, Ser 1.29 (*)    GFR, Estimated 41 (*)    All other components within normal limits  LIPASE, BLOOD  URINALYSIS, ROUTINE W REFLEX MICROSCOPIC    EKG None  Radiology No results found.  Procedures Procedures    Medications Ordered in ED Medications  traMADol (ULTRAM) tablet 50 mg (50 mg Oral Given 04/12/23 0619)    ED Course/ Medical Decision Making/ A&P                                 Medical Decision Making  This patient presents to the ED for concern of constipation, bright red blood per rectum, this involves an extensive number of treatment options, and is a complaint that carries with it a high risk of complications and morbidity.  The differential diagnosis includes but not limited to hemorrhoids, mass, diverticulitis, fissure    Co morbidities that complicate the patient evaluation  On Eliquis for history of b/l DVT, GERD, HTN, chronic lung infection    Additional history obtained:  Additional history obtained from daughter at bedside who contributes to history as above External records from outside source obtained and reviewed including prior labs on file for comparison   Lab Tests:  I Ordered, and personally interpreted labs.  The pertinent results include: CBC with mild leukocytosis white count 11.4 with increase in neutrophils.  CMP with creatinine 1.29, gradual uptrend when compared to prior on file.  Lipase normal.   Imaging  Studies ordered:  I ordered imaging studies including CT a/p  CT pending at time of signout to oncoming provider   Problem List / ED Course / Critical interventions / Medication management  82 year old female brought in by EMS from home, daughter at bedside.  Patient has been constipated, no relief with milk of magnesia and MiraLAX.  Reports that she has been straining on the commode.  Patient woke  up this morning and noticed a few spots of bright red blood on her pad.  She is on Eliquis for history of prior bilateral DVTs.  Found to have mild generalized abdominal tenderness.  No significant anemia.  Patient is signed out to oncoming provider pending CT scan of abdomen and pelvis with contrast to evaluate for diverticulitis versus mass or obstruction causing her constipation.  May benefit from enema in the ER if cleared after CT.  This was discussed with patient who will discuss with her daughter. I ordered medication including tramadol  for migraine  I have reviewed the patients home medicines and have made adjustments as needed   Social Determinants of Health:  Lives at home   Test / Admission - Considered:  Disposition pending at time of signout         Final Clinical Impression(s) / ED Diagnoses Final diagnoses:  Rectal bleeding  Generalized abdominal pain    Rx / DC Orders ED Discharge Orders     None         Jeannie Fend, PA-C 04/12/23 7062    Glendora Score, MD 04/13/23 1515

## 2023-04-12 NOTE — ED Triage Notes (Signed)
Per EMS, they were called out for GI Bleed but no active bleeding noted. Per EMS pt had 3 spots of blood on her sanitary pad. Daughter reports that pt has been constipated for a week, noticed the blood this morning.

## 2023-05-03 ENCOUNTER — Other Ambulatory Visit: Payer: Self-pay | Admitting: Gastroenterology

## 2023-05-03 ENCOUNTER — Ambulatory Visit
Admission: RE | Admit: 2023-05-03 | Discharge: 2023-05-03 | Disposition: A | Discharge status: Home / Self Care | Payer: PPO | Source: Ambulatory Visit | Attending: Gastroenterology

## 2023-05-03 DIAGNOSIS — Z8719 Personal history of other diseases of the digestive system: Secondary | ICD-10-CM

## 2023-05-09 NOTE — Telephone Encounter (Signed)
When somebody with headaches start taking pain relievers (including NSAIDs like Advil/Aleve, Tylenol, tramadol, etc) too frequently, their body gets dependent on it. So when the medication is out of their system, the headache returns (rebounds) until they take another pill.  My rule of thumb is that patients should not take a pain reliever more than 2 days out of the week (or total of 10 days out of the month).  More than that puts them at risk for rebound headaches.  For somebody with daily headaches, this can be difficult because they are having daily headaches and now I am saying they can't take something for them more than 10 days out of the month.  Also, initiating the daily preventative medication (in this case divalproex) takes time to take effect and may require increased dosing.  Unfortunately, there isn't a quick fix.  The alternative treatment for chronic migraines is Botox, which are injections that I perform in the office every 3 months.

## 2023-05-29 ENCOUNTER — Other Ambulatory Visit: Payer: Self-pay | Admitting: Neurology

## 2023-07-10 ENCOUNTER — Encounter: Payer: Self-pay | Admitting: Neurology

## 2023-07-10 NOTE — Progress Notes (Signed)
Virtual Visit via Video Note  Consent was obtained for video visit:  Yes.   Answered questions that patient had about telehealth interaction:  Yes.   I discussed the limitations, risks, security and privacy concerns of performing an evaluation and management service by telemedicine. I also discussed with the patient that there may be a patient responsible charge related to this service. The patient expressed understanding and agreed to proceed.  Pt location: Home Physician Location: office Name of referring provider:  Jackelyn Poling, DO I connected with Denise Hawkins at patients initiation/request on 07/12/2023 at  8:50 AM EST by video enabled telemedicine application and verified that I am speaking with the correct person using two identifiers. Pt MRN:  [id number] Pt DOB:  1941-06-02 Video Participants:  Denise Hawkins   Assessment/Plan:   Recurrent episodes of transient altered awareness - unclear if these represent partial seizures or related to anxiety Chronic migraine without aura, without status migrainosus, intractable     Will order 72 hour ambulatory EEG in attempt to capture spell.  Routine EEG was normal.   Tramadol as needed for acute headaches Follow up 6 months.     Subjective:  Denise Hawkins is an 82 year old female with HTN, lumbar spinal stenosis and history of DVT and pleomorphic adenoma of salivary gland who follows up for migraines and spells.  She is accompanied by her daughter who supplements history.   UPDATE: Migraine headaches occur 2-3 times a week.  Treats with Tylenol Arthritis and Ativan.  Will also take a tramadol once in awhile (infrequent).  She stopped Keppra because it made her feel "weird".  She prefers not to take any preventative medications at this time.  Spells with  altered sensation and confusion lasts few times a week.    MRI of brain without contrast on 04/05/2023 showed some chronic small vessel ischemic changes but no acute  abnormalities.    04/12/2023 LABS:  CBC with WBC 11.4, HGB 12.7, HCT 38.1, PLT 250; CMP with Na 131, K 3.7, Cl 94, CO2 27, glucose 150, BUN 18, Cr 1.29, Ca 9.8, eGFR 41, t bili 0.9, ALP 57, AST 18, ALT 12   Current NSAIDS/analgesics:  Tylenol, tramadol 50mg  Current triptans:  none Current ergotamine:  none Current anti-emetic:  none Current muscle relaxants:  none Current Antihypertensive medications:  atenolol 25mg , amlodipine, enalapril, HCTZ Current Antidepressant medications:  none Current Anticonvulsant medications:  none Current anti-CGRP:  none Current Vitamins/Herbal/Supplements:  none Current Antihistamines/Decongestants:  none Other therapy:  none Hormone/birth control:  none Other medications:  lorazepam 0.5mg  BID PRN daily (anxiety), Eliquis   HISTORY: She started having "spells" since December 2022.  She was stepping out of her kitchen when the phone rang.  She walked back into the kitchen when she suddenly felt like "I was in a bubble" with generalized weakness.  Legs became weak and almost gave out.  She answered the phone and she must have sounded altered because her son, on the line, asked what was wrong.  She was able to answer her son on the line.  Symptoms gradually resolved   It lasted 20 minutes but felt drained for the rest of the rest of the day or two.  No loss of consciousness.  Some associated diaphoresis and nausea but no phantosmia or visual phenomena.  She may become confused afterwards for 10 minutes, sometimes may get day and night mixed up.  Mild spells occur once a week but more severe spells occur  about once or twice a month.  Carotid ultrasound at that time showed mild atherosclerotic plaque in the left ICA but no hemodynamically significant stenosis.  No history of head trauma or prior seizures.  MRI of brain was ordered but she is to phobic to proceed with it.  Deferred anxiolytic or CT.  EEG on 05/25/2022 was normal.   She has history of migraines since age  9.  They are severe diffuse pounding headache with nausea, photophobia, phonophobia and sometimes vomiting.  May last 1-3 days.  Often occur in clusters occurring off and on for 2-4 weeks.  Then she may go 4 weeks without a headache.  She takes an Ativan 0.25mg  and arthritic strength Tylenol daily to prevent onset of migraines.  On this regimen, they occur once a month.  When she has the "spells", a migraine may occur.  She also has a persistent dull headache since childhood.    Past medications:  zonisamide, levetiracetam (side effects)   Past Medical History: Past Medical History:  Diagnosis Date   Anxiety    Arthritis    DVT (deep venous thrombosis) (HCC)    Gastritis    GERD (gastroesophageal reflux disease)    GERD (gastroesophageal reflux disease)    Headache(784.0)    migraines   Hypertension    PONV (postoperative nausea and vomiting)     Medications: Outpatient Encounter Medications as of 07/12/2023  Medication Sig Note   ALPRAZolam (XANAX) 0.5 MG tablet Take 1 tablet 30 minutes before MRI.    acetaminophen (TYLENOL) 500 MG tablet Take 500 mg by mouth every 6 (six) hours as needed (for pain.).    aluminum hydroxide-magnesium carbonate (GAVISCON) 95-358 MG/15ML SUSP Take 15 mLs by mouth as needed.    amLODipine (NORVASC) 2.5 MG tablet Take 2.5 mg by mouth daily.    atenolol (TENORMIN) 25 MG tablet Take 25 mg by mouth every morning.    Chlorpheniramine-DM (CORICIDIN HBP COUGH/COLD PO) Take 1 tablet by mouth every 6 (six) hours as needed (for cold/congestion.).    diazepam (VALIUM) 5 MG tablet Take 1 tablet 30-40 minutes prior to CT    Docusate Sodium (PHILLIPS LIQUI-GELS PO) Take 1 capsule by mouth daily as needed (for constipation.).    ELIQUIS 5 MG TABS tablet Take 5 mg by mouth daily.    enalapril (VASOTEC) 20 MG tablet Take 20 mg by mouth 2 (two) times daily.    hydrochlorothiazide (MICROZIDE) 12.5 MG capsule Take 12.5 mg by mouth daily.    levETIRAcetam (KEPPRA) 500 MG  tablet TAKE 1/2 TABLET BY MOUTH TWICE DAILY    LORazepam (ATIVAN) 0.5 MG tablet Take 0.5 mg by mouth 2 (two) times daily as needed (for anxiety/migraine headaches.).  12/21/2022: Twice a day   omeprazole (PRILOSEC) 20 MG capsule Take 20 mg by mouth daily before breakfast.     sucralfate (CARAFATE) 1 g tablet Take 1 g by mouth 4 (four) times daily -  before meals and at bedtime.    traMADol (ULTRAM) 50 MG tablet Take 1 tablet (50 mg total) by mouth daily as needed.    No facility-administered encounter medications on file as of 07/12/2023.    Allergies: Allergies  Allergen Reactions   Latex Rash    Blisters   Penicillins Rash    Has patient had a PCN reaction causing immediate rash, facial/tongue/throat swelling, SOB or lightheadedness with hypotension: Yes Has patient had a PCN reaction causing severe rash involving mucus membranes or skin necrosis: Unknown Has patient had a  PCN reaction that required hospitalization: No Has patient had a PCN reaction occurring within the last 10 years:No If all of the above answers are "NO", then may proceed with Cephalosporin use.    Sulfa Antibiotics Rash    Family History: Family History  Problem Relation Age of Onset   Migraines Mother    Heart disease Father     Observations/Objective:   No acute distress.  Alert and oriented.  Speech fluent and not dysarthric.  Language intact.     Follow Up Instructions:    -I discussed the assessment and treatment plan with the patient. The patient was provided an opportunity to ask questions and all were answered. The patient agreed with the plan and demonstrated an understanding of the instructions.   The patient was advised to call back or seek an in-person evaluation if the symptoms worsen or if the condition fails to improve as anticipated.   Cira Servant, DO    CC: Jackelyn Poling, DO

## 2023-07-12 ENCOUNTER — Encounter: Payer: Self-pay | Admitting: Neurology

## 2023-07-12 ENCOUNTER — Telehealth: Payer: PPO | Admitting: Neurology

## 2023-07-12 DIAGNOSIS — G43019 Migraine without aura, intractable, without status migrainosus: Secondary | ICD-10-CM | POA: Diagnosis not present

## 2023-07-12 DIAGNOSIS — R404 Transient alteration of awareness: Secondary | ICD-10-CM

## 2023-07-12 MED ORDER — TRAMADOL HCL 50 MG PO TABS: 50.0000 mg | ORAL_TABLET | Freq: Every day | ORAL | 5 refills | Status: DC | PRN

## 2023-07-23 ENCOUNTER — Telehealth: Payer: Self-pay | Admitting: *Deleted

## 2023-08-08 ENCOUNTER — Encounter: Payer: Self-pay | Admitting: Neurology

## 2023-08-19 ENCOUNTER — Telehealth: Payer: Self-pay | Admitting: *Deleted

## 2024-01-01 ENCOUNTER — Telehealth: Payer: Self-pay | Admitting: *Deleted

## 2024-01-03 ENCOUNTER — Other Ambulatory Visit: Payer: PPO

## 2024-01-03 DIAGNOSIS — F419 Anxiety disorder, unspecified: Secondary | ICD-10-CM | POA: Diagnosis not present

## 2024-01-03 DIAGNOSIS — D6869 Other thrombophilia: Secondary | ICD-10-CM | POA: Diagnosis not present

## 2024-01-03 DIAGNOSIS — R404 Transient alteration of awareness: Secondary | ICD-10-CM | POA: Diagnosis not present

## 2024-01-03 DIAGNOSIS — I10 Essential (primary) hypertension: Secondary | ICD-10-CM | POA: Diagnosis not present

## 2024-01-03 DIAGNOSIS — N1831 Chronic kidney disease, stage 3a: Secondary | ICD-10-CM | POA: Diagnosis not present

## 2024-01-10 ENCOUNTER — Ambulatory Visit: Payer: PPO | Admitting: Neurology

## 2024-01-15 ENCOUNTER — Ambulatory Visit: Payer: PPO | Admitting: Neurology

## 2024-01-23 NOTE — Progress Notes (Unsigned)
 NEUROLOGY FOLLOW UP OFFICE NOTE  Denise Hawkins [id number]  Assessment/Plan:   Recurrent episodes of transient altered awareness - unclear if these represent partial seizures or related to anxiety Chronic migraine without aura, without status migrainosus, intractable  Hypertension - not typically this elevated.  Declines repeat check.    72 hour ambulatory EEG in attempt to capture spell.   Tylenol  and/or tramadol  as needed for acute headaches Check blood pressure at home.  If still elevated, contact PCP Follow up 6 months.     Subjective:  Denise Hawkins is an 83 year old female with HTN, lumbar spinal stenosis and history of DVT and pleomorphic adenoma of salivary gland who follows up for migraines and spells.  She is accompanied by her daughter who supplements history.   UPDATE: Migraines: Migraine headaches occur 2-3 times a week.  Treats with Tylenol  Arthritis and Ativan .  Will also take a tramadol  once in awhile (infrequent).  Transient spells: She prefers not to take any preventative medications at this time.  Did not have the 72 hour ambulatory EEG performed because she didn't feel well.  Now occurs once every 4 weeks or so (no longer several times a week).      Current NSAIDS/analgesics:  Tylenol , tramadol  50mg  Current triptans:  none Current ergotamine:  none Current anti-emetic:  none Current muscle relaxants:  none Current Antihypertensive medications:  atenolol  25mg , amlodipine , enalapril , HCTZ Current Antidepressant medications:  none Current Anticonvulsant medications:  none Current anti-CGRP:  none Current Vitamins/Herbal/Supplements:  none Current Antihistamines/Decongestants:  none Other therapy:  none Hormone/birth control:  none Other medications:  lorazepam  0.5mg  BID PRN daily (anxiety), Eliquis   HISTORY: Transient spells: She started having "spells" since December 2022.  She was stepping out of her kitchen when the phone rang.  She walked back  into the kitchen when she suddenly felt like "I was in a bubble" with generalized weakness.  Legs became weak and almost gave out.  She answered the phone and she must have sounded altered because her son, on the line, asked what was wrong.  She was able to answer her son on the line.  Symptoms gradually resolved   It lasted 20 minutes but felt drained for the rest of the rest of the day or two.  No loss of consciousness.  Some associated diaphoresis and nausea but no phantosmia or visual phenomena.  She may become confused afterwards for 10 minutes, sometimes may get day and night mixed up.  Mild spells occur once a week but more severe spells occur about once or twice a month.  Carotid ultrasound at that time showed mild atherosclerotic plaque in the left ICA but no hemodynamically significant stenosis.  No history of head trauma or prior seizures.  EEG on 05/25/2022 was normal.  MRI of brain without contrast on 04/05/2023 showed some chronic small vessel ischemic changes but no acute abnormalities.     Migraines: She has history of migraines since age 76.  They are severe diffuse pounding headache with nausea, photophobia, phonophobia and sometimes vomiting.  May last 1-3 days.  Often occur in clusters occurring off and on for 2-4 weeks.  Then she may go 4 weeks without a headache.  She takes an Ativan  0.25mg  and arthritic strength Tylenol  daily to prevent onset of migraines.  On this regimen, they occur once a month.  When she has the "spells", a migraine may occur.  She also has a persistent dull headache since childhood.  Past medications:  zonisamide , levetiracetam  (side effects)  PAST MEDICAL HISTORY: Past Medical History:  Diagnosis Date   Anxiety    Arthritis    DVT (deep venous thrombosis) (HCC)    Gastritis    GERD (gastroesophageal reflux disease)    GERD (gastroesophageal reflux disease)    Headache(784.0)    migraines   Hypertension    PONV (postoperative nausea and vomiting)      MEDICATIONS: Current Outpatient Medications on File Prior to Visit  Medication Sig Dispense Refill   acetaminophen  (TYLENOL ) 500 MG tablet Take 500 mg by mouth every 6 (six) hours as needed (for pain.).     amLODipine  (NORVASC ) 2.5 MG tablet Take 2.5 mg by mouth daily.     atenolol  (TENORMIN ) 25 MG tablet Take 25 mg by mouth every morning.     diazepam  (VALIUM ) 5 MG tablet Take 1 tablet 30-40 minutes prior to CT 1 tablet 0   ELIQUIS 5 MG TABS tablet Take 5 mg by mouth daily.     enalapril  (VASOTEC ) 20 MG tablet Take 20 mg by mouth 2 (two) times daily.     hydrochlorothiazide  (MICROZIDE ) 12.5 MG capsule Take 12.5 mg by mouth daily.     LORazepam  (ATIVAN ) 0.5 MG tablet Take 0.5 mg by mouth 2 (two) times daily as needed (for anxiety/migraine headaches.).      omeprazole (PRILOSEC) 20 MG capsule Take 20 mg by mouth daily before breakfast.      sucralfate  (CARAFATE ) 1 g tablet Take 1 g by mouth 4 (four) times daily -  before meals and at bedtime.     traMADol  (ULTRAM ) 50 MG tablet Take 1 tablet (50 mg total) by mouth daily as needed. 15 tablet 5   No current facility-administered medications on file prior to visit.    ALLERGIES: Allergies  Allergen Reactions   Latex Rash    Blisters   Penicillins Rash    Has patient had a PCN reaction causing immediate rash, facial/tongue/throat swelling, SOB or lightheadedness with hypotension: Yes Has patient had a PCN reaction causing severe rash involving mucus membranes or skin necrosis: Unknown Has patient had a PCN reaction that required hospitalization: No Has patient had a PCN reaction occurring within the last 10 years:No If all of the above answers are "NO", then may proceed with Cephalosporin use.    Sulfa Antibiotics Rash    FAMILY HISTORY: Family History  Problem Relation Age of Onset   Migraines Mother    Heart disease Father       Objective:  Blood pressure (!) 169/80, pulse (!) 58, height 5\' 6"  (1.676 m), weight 135 lb 3.2  oz (61.3 kg), SpO2 96%.  General: No acute distress.  Patient appears well-groomed.   Head:  Normocephalic/atraumatic Eyes:  Fundi examined but not visualized Neck: supple, no paraspinal tenderness, full range of motion Heart:  Regular rate and rhythm Lungs:  Clear to auscultation bilaterally Back: No paraspinal tenderness Neurological Exam: alert and oriented.  Speech fluent and not dysarthric, language intact.  CN II-XII intact. Bulk and tone normal, muscle strength 5/5 throughout.  Sensation to light touch intact.  Deep tendon reflexes 2+ throughout, toes downgoing.  Finger to nose testing intact.  Cautious, wide-based.  Ambulates with cane, Romberg positive   Janne Members, DO  CC: Mordechai April, DO

## 2024-01-24 ENCOUNTER — Ambulatory Visit: Payer: PPO | Admitting: Neurology

## 2024-01-24 ENCOUNTER — Encounter: Payer: Self-pay | Admitting: Neurology

## 2024-01-24 VITALS — BP 151/76 | HR 58 | Ht 66.0 in | Wt 135.2 lb

## 2024-01-24 DIAGNOSIS — R404 Transient alteration of awareness: Secondary | ICD-10-CM | POA: Diagnosis not present

## 2024-01-24 DIAGNOSIS — G43009 Migraine without aura, not intractable, without status migrainosus: Secondary | ICD-10-CM | POA: Diagnosis not present

## 2024-01-24 DIAGNOSIS — I10 Essential (primary) hypertension: Secondary | ICD-10-CM

## 2024-01-24 NOTE — Patient Instructions (Addendum)
 72 hour ambulatory eeg Follow up 6 months. Tylenol  Arthritis and tramadol  as needed for migraine. Recheck Blood pressure at home.  If still elevated, contact PCP

## 2024-01-27 ENCOUNTER — Encounter: Payer: Self-pay | Admitting: Neurology

## 2024-01-28 ENCOUNTER — Telehealth: Payer: Self-pay | Admitting: Neurology

## 2024-01-28 NOTE — Telephone Encounter (Signed)
 Pt daughter is returning a call to Benjiman Bras for EEG sch

## 2024-02-07 ENCOUNTER — Other Ambulatory Visit

## 2024-02-20 ENCOUNTER — Other Ambulatory Visit: Payer: Self-pay | Admitting: Neurology

## 2024-03-13 ENCOUNTER — Other Ambulatory Visit

## 2024-03-27 DIAGNOSIS — L304 Erythema intertrigo: Secondary | ICD-10-CM | POA: Diagnosis not present

## 2024-04-17 DIAGNOSIS — R1013 Epigastric pain: Secondary | ICD-10-CM | POA: Diagnosis not present

## 2024-04-17 DIAGNOSIS — Z8719 Personal history of other diseases of the digestive system: Secondary | ICD-10-CM | POA: Diagnosis not present

## 2024-04-17 DIAGNOSIS — K5909 Other constipation: Secondary | ICD-10-CM | POA: Diagnosis not present

## 2024-07-03 DIAGNOSIS — H43813 Vitreous degeneration, bilateral: Secondary | ICD-10-CM | POA: Diagnosis not present

## 2024-07-03 DIAGNOSIS — Z6823 Body mass index (BMI) 23.0-23.9, adult: Secondary | ICD-10-CM | POA: Diagnosis not present

## 2024-07-03 DIAGNOSIS — E7800 Pure hypercholesterolemia, unspecified: Secondary | ICD-10-CM | POA: Diagnosis not present

## 2024-07-03 DIAGNOSIS — H35369 Drusen (degenerative) of macula, unspecified eye: Secondary | ICD-10-CM | POA: Diagnosis not present

## 2024-07-03 DIAGNOSIS — G43919 Migraine, unspecified, intractable, without status migrainosus: Secondary | ICD-10-CM | POA: Diagnosis not present

## 2024-07-03 DIAGNOSIS — N1831 Chronic kidney disease, stage 3a: Secondary | ICD-10-CM | POA: Diagnosis not present

## 2024-07-03 DIAGNOSIS — F419 Anxiety disorder, unspecified: Secondary | ICD-10-CM | POA: Diagnosis not present

## 2024-07-03 DIAGNOSIS — Z0000 Encounter for general adult medical examination without abnormal findings: Secondary | ICD-10-CM | POA: Diagnosis not present

## 2024-07-03 DIAGNOSIS — I10 Essential (primary) hypertension: Secondary | ICD-10-CM | POA: Diagnosis not present

## 2024-07-03 DIAGNOSIS — H26493 Other secondary cataract, bilateral: Secondary | ICD-10-CM | POA: Diagnosis not present

## 2024-07-03 DIAGNOSIS — H16223 Keratoconjunctivitis sicca, not specified as Sjogren's, bilateral: Secondary | ICD-10-CM | POA: Diagnosis not present

## 2024-07-24 DIAGNOSIS — I10 Essential (primary) hypertension: Secondary | ICD-10-CM | POA: Diagnosis not present

## 2024-07-24 DIAGNOSIS — E7800 Pure hypercholesterolemia, unspecified: Secondary | ICD-10-CM | POA: Diagnosis not present

## 2024-07-24 DIAGNOSIS — N1831 Chronic kidney disease, stage 3a: Secondary | ICD-10-CM | POA: Diagnosis not present

## 2024-08-18 NOTE — Progress Notes (Deleted)
 "  NEUROLOGY FOLLOW UP OFFICE NOTE  Denise Hawkins [id number]  Assessment/Plan:   Recurrent episodes of transient altered awareness - unclear if these represent partial seizures or related to anxiety Chronic migraine without aura, without status migrainosus, intractable  Hypertension - not typically this elevated.  Declines repeat check.    72 hour ambulatory EEG in attempt to capture spell.   Tylenol  and/or tramadol  as needed for acute headaches Check blood pressure at home.  If still elevated, contact PCP Follow up 6 months.     Subjective:  Denise Hawkins is an 83 year old female with HTN, lumbar spinal stenosis and history of DVT and pleomorphic adenoma of salivary gland who follows up for migraines and spells.  She is accompanied by her daughter who supplements history.   UPDATE: Migraines: Migraine headaches occur 2-3 times a week.  Treats with Tylenol  Arthritis and Ativan .  Will also take a tramadol  once in awhile (infrequent).  Transient spells: She prefers not to take any preventative medications at this time.  Did not have the 72 hour ambulatory EEG performed because ***  Now occurs once every 4 weeks or so (no longer several times a week).      Current NSAIDS/analgesics:  Tylenol , tramadol  50mg  Current triptans:  none Current ergotamine:  none Current anti-emetic:  none Current muscle relaxants:  none Current Antihypertensive medications:  atenolol  25mg , amlodipine , enalapril , HCTZ Current Antidepressant medications:  none Current Anticonvulsant medications:  none Current anti-CGRP:  none Current Vitamins/Herbal/Supplements:  none Current Antihistamines/Decongestants:  none Other therapy:  none Hormone/birth control:  none Other medications:  lorazepam  0.5mg  BID PRN daily (anxiety), Eliquis   HISTORY: Transient spells: She started having spells since December 2022.  She was stepping out of her kitchen when the phone rang.  She walked back into the kitchen  when she suddenly felt like I was in a bubble with generalized weakness.  Legs became weak and almost gave out.  She answered the phone and she must have sounded altered because her son, on the line, asked what was wrong.  She was able to answer her son on the line.  Symptoms gradually resolved   It lasted 20 minutes but felt drained for the rest of the rest of the day or two.  No loss of consciousness.  Some associated diaphoresis and nausea but no phantosmia or visual phenomena.  She may become confused afterwards for 10 minutes, sometimes may get day and night mixed up.  Mild spells occur once a week but more severe spells occur about once or twice a month.  Carotid ultrasound at that time showed mild atherosclerotic plaque in the left ICA but no hemodynamically significant stenosis.  No history of head trauma or prior seizures.  EEG on 05/25/2022 was normal.  MRI of brain without contrast on 04/05/2023 showed some chronic small vessel ischemic changes but no acute abnormalities.     Migraines: She has history of migraines since age 39.  They are severe diffuse pounding headache with nausea, photophobia, phonophobia and sometimes vomiting.  May last 1-3 days.  Often occur in clusters occurring off and on for 2-4 weeks.  Then she may go 4 weeks without a headache.  She takes an Ativan  0.25mg  and arthritic strength Tylenol  daily to prevent onset of migraines.  On this regimen, they occur once a month.  When she has the spells, a migraine may occur.  She also has a persistent dull headache since childhood.    Past medications:  zonisamide , levetiracetam  (side effects)  PAST MEDICAL HISTORY: Past Medical History:  Diagnosis Date   Anxiety    Arthritis    DVT (deep venous thrombosis) (HCC)    Gastritis    GERD (gastroesophageal reflux disease)    GERD (gastroesophageal reflux disease)    Headache(784.0)    migraines   Hypertension    PONV (postoperative nausea and vomiting)      MEDICATIONS: Current Outpatient Medications on File Prior to Visit  Medication Sig Dispense Refill   acetaminophen  (TYLENOL ) 500 MG tablet Take 500 mg by mouth every 6 (six) hours as needed (for pain.).     amLODipine  (NORVASC ) 2.5 MG tablet Take 2.5 mg by mouth daily.     atenolol  (TENORMIN ) 25 MG tablet Take 25 mg by mouth every morning.     diazepam  (VALIUM ) 5 MG tablet Take 1 tablet 30-40 minutes prior to CT 1 tablet 0   ELIQUIS 5 MG TABS tablet Take 5 mg by mouth daily.     enalapril  (VASOTEC ) 20 MG tablet Take 20 mg by mouth 2 (two) times daily.     hydrochlorothiazide  (MICROZIDE ) 12.5 MG capsule Take 12.5 mg by mouth daily.     LORazepam  (ATIVAN ) 0.5 MG tablet Take 0.5 mg by mouth 2 (two) times daily as needed (for anxiety/migraine headaches.).      omeprazole (PRILOSEC) 20 MG capsule Take 20 mg by mouth daily before breakfast.      sertraline (ZOLOFT) 25 MG tablet Take 25 mg by mouth at bedtime.     sucralfate  (CARAFATE ) 1 g tablet Take 1 g by mouth 4 (four) times daily -  before meals and at bedtime.     traMADol  (ULTRAM ) 50 MG tablet TAKE 1 TABLET BY MOUTH DAILY AS NEEDED 15 tablet 2   No current facility-administered medications on file prior to visit.    ALLERGIES: Allergies  Allergen Reactions   Latex Rash    Blisters   Penicillins Rash    Has patient had a PCN reaction causing immediate rash, facial/tongue/throat swelling, SOB or lightheadedness with hypotension: Yes Has patient had a PCN reaction causing severe rash involving mucus membranes or skin necrosis: Unknown Has patient had a PCN reaction that required hospitalization: No Has patient had a PCN reaction occurring within the last 10 years:No If all of the above answers are NO, then may proceed with Cephalosporin use.    Sulfa Antibiotics Rash    FAMILY HISTORY: Family History  Problem Relation Age of Onset   Migraines Mother    Heart disease Father       Objective:  *** General: No acute  distress.  Patient appears well-groomed.   Head:  Normocephalic/atraumatic Eyes:  Fundi examined but not visualized Neck: supple, no paraspinal tenderness, full range of motion Heart:  Regular rate and rhythm Lungs:  Clear to auscultation bilaterally Back: No paraspinal tenderness Neurological Exam: alert and oriented.  Speech fluent and not dysarthric, language intact.  CN II-XII intact. Bulk and tone normal, muscle strength 5/5 throughout.  Sensation to light touch intact.  Deep tendon reflexes 2+ throughout, toes downgoing.  Finger to nose testing intact.  Cautious, wide-based.  Ambulates with cane, Romberg positive  ***   Juliene Dunnings, DO  CC: Bernardino Boone, DO       "

## 2024-08-19 ENCOUNTER — Ambulatory Visit: Admitting: Neurology

## 2024-09-18 ENCOUNTER — Other Ambulatory Visit: Payer: Self-pay | Admitting: Neurology
